# Patient Record
Sex: Female | Born: 1980 | Race: Black or African American | Hispanic: No | Marital: Single | Smoking: Never smoker
Health system: Southern US, Community
[De-identification: ages and names within clinical notes are randomized; demographics above are authoritative.]

## PROBLEM LIST (undated history)

## (undated) DIAGNOSIS — E282 Polycystic ovarian syndrome: Secondary | ICD-10-CM

---

## 2008-09-09 ENCOUNTER — Emergency Department (HOSPITAL_COMMUNITY): Admission: EM | Admit: 2008-09-09 | Discharge: 2008-09-09 | Payer: Self-pay | Admitting: *Deleted

## 2009-02-22 ENCOUNTER — Inpatient Hospital Stay: Payer: Self-pay | Admitting: Unknown Physician Specialty

## 2009-08-22 ENCOUNTER — Emergency Department: Payer: Self-pay | Admitting: Internal Medicine

## 2010-01-02 ENCOUNTER — Emergency Department: Payer: Self-pay | Admitting: Emergency Medicine

## 2010-07-29 ENCOUNTER — Emergency Department (HOSPITAL_BASED_OUTPATIENT_CLINIC_OR_DEPARTMENT_OTHER)
Admission: EM | Admit: 2010-07-29 | Discharge: 2010-07-29 | Payer: Self-pay | Source: Home / Self Care | Admitting: Internal Medicine

## 2010-07-29 LAB — URINALYSIS, ROUTINE W REFLEX MICROSCOPIC
Bilirubin Urine: NEGATIVE
Hemoglobin, Urine: NEGATIVE
Ketones, ur: NEGATIVE mg/dL
Nitrite: NEGATIVE
Protein, ur: NEGATIVE mg/dL
Specific Gravity, Urine: 1.022 (ref 1.005–1.030)
Urine Glucose, Fasting: NEGATIVE mg/dL
Urobilinogen, UA: 1 mg/dL (ref 0.0–1.0)
pH: 6.5 (ref 5.0–8.0)

## 2010-07-29 LAB — WET PREP, GENITAL
Clue Cells Wet Prep HPF POC: NONE SEEN
Trich, Wet Prep: NONE SEEN
Yeast Wet Prep HPF POC: NONE SEEN

## 2010-07-29 LAB — PREGNANCY, URINE: Preg Test, Ur: NEGATIVE

## 2010-08-08 LAB — GC/CHLAMYDIA PROBE AMP, GENITAL
Chlamydia, DNA Probe: NEGATIVE
GC Probe Amp, Genital: NEGATIVE

## 2010-10-25 ENCOUNTER — Ambulatory Visit: Payer: Self-pay | Admitting: Gynecology

## 2010-11-08 LAB — CULTURE, ROUTINE-ABSCESS: Gram Stain: NONE SEEN

## 2011-03-15 ENCOUNTER — Inpatient Hospital Stay (INDEPENDENT_AMBULATORY_CARE_PROVIDER_SITE_OTHER)
Admission: RE | Admit: 2011-03-15 | Discharge: 2011-03-15 | Disposition: A | Discharge status: Home / Self Care | Payer: Self-pay | Source: Ambulatory Visit | Attending: Family Medicine | Admitting: Family Medicine

## 2011-03-15 ENCOUNTER — Encounter: Payer: Self-pay | Admitting: Family Medicine

## 2011-03-15 DIAGNOSIS — R109 Unspecified abdominal pain: Secondary | ICD-10-CM

## 2011-03-15 DIAGNOSIS — R51 Headache: Secondary | ICD-10-CM

## 2011-03-15 DIAGNOSIS — K5900 Constipation, unspecified: Secondary | ICD-10-CM | POA: Insufficient documentation

## 2011-03-15 DIAGNOSIS — E282 Polycystic ovarian syndrome: Secondary | ICD-10-CM | POA: Insufficient documentation

## 2011-03-15 LAB — CONVERTED CEMR LAB
Beta hcg, urine, semiquantitative: NEGATIVE
Bilirubin Urine: NEGATIVE
Blood in Urine, dipstick: NEGATIVE
Glucose, Urine, Semiquant: NEGATIVE
Ketones, urine, test strip: NEGATIVE
Nitrite: NEGATIVE
Protein, U semiquant: NEGATIVE
Specific Gravity, Urine: >=1.03
Urobilinogen, UA: 0.2
WBC Urine, dipstick: NEGATIVE
pH: 6

## 2011-03-17 ENCOUNTER — Telehealth (INDEPENDENT_AMBULATORY_CARE_PROVIDER_SITE_OTHER): Payer: Self-pay | Admitting: Emergency Medicine

## 2011-06-26 NOTE — Telephone Encounter (Signed)
  Phone Note Outgoing Call   Call placed by: Lavell Islam RN,  March 17, 2011 12:20 PM Call placed to: Patient Action Taken: Phone Call Completed Summary of Call: Left message on voice mail at work: inquiring about patient's headache and GI status. Encouraged her to call with questions/concerns. Initial call taken by: Lavell Islam RN,  March 17, 2011 12:21 PM

## 2011-06-26 NOTE — Letter (Signed)
Summary: Out of Work  [facility]  [address]   [place], [place] [address]   Phone: [phone]  Fax: [phone]    March 15, 2011   Employee:  Tommi Emery    To Whom It May Concern:   For Medical reasons, please excuse the above named employee from work for the following dates:  Start:   03/15/2011  Return:   03/17/2011  If you need additional information, please feel free to contact our office.         Sincerely,    Hassan Rowan MD

## 2011-06-26 NOTE — Progress Notes (Signed)
Summary: Migraine/stomach aches   Vital Signs:  Patient Profile:   30 Years Old Female CC:      Headache, forehead x 3 days, stomach cramps x 2 days LMP:     02/06/2011 Height:     68 inches Weight:      230 pounds O2 Sat:      99 % O2 treatment:    Room Air Temp:     97.6 degrees F oral Pulse rate:   67 / minute Pulse rhythm:   regular Resp:     14 per minute BP sitting:   129 / 84  (left arm) Cuff size:   large  Pt. in pain?   yes    Location:   head    Intensity:   8    Type:       sharp  Vitals Entered By: Emilio Math (March 15, 2011 9:04 AM)  Menstrual History: LMP (date): 02/06/2011                   Prior Medication List:  No prior medications documented  Current Allergies: No known allergies History of Present Illness Chief Complaint: Headache, forehead x 3 days, stomach cramps x 2 days History of Present Illness: Patient reports not feeling well the last 3-4 days. She has had headaches mid center of her forehead that has been off and on for the last 3 datys. She has been eating but has had crampy abdominal pain constipation for the last 3 days and abdominal pain as well. She has had irregular periods before because she has polycystic ovarian DX. In fact she states that what is strange is that she had a period at all last month.   Current Problems: ABDOMINAL PAIN, ACUTE (ICD-789.00) POLYCYSTIC OVARIAN DISEASE (ICD-256.4) CONSTIPATION, MILD (ICD-564.00) HEADACHE (ICD-784.0)   Current Meds CITRATE OF MAGNESIA 1.745 GM/30ML SOLN (MAGNESIUM CITRATE) 1/2  bottle initial and no improvement or results in 2-4 hrs please take other 1/2 IMITREX 100 MG TABS (SUMATRIPTAN SUCCINATE) 1 by mouth at the onset of migraine. May repeat dose in one hour if headache not resolved maximum 2 dosages in 24 hrs  REVIEW OF SYSTEMS Constitutional Symptoms      Denies fever, chills, night sweats, weight loss, weight gain, and fatigue.  Eyes       Complains of eye pain.       Denies change in vision, eye discharge, glasses, contact lenses, and eye surgery. Ear/Nose/Throat/Mouth       Denies hearing loss/aids, change in hearing, ear pain, ear discharge, dizziness, frequent runny nose, frequent nose bleeds, sinus problems, sore throat, hoarseness, and tooth pain or bleeding.  Respiratory       Denies dry cough, productive cough, wheezing, shortness of breath, asthma, bronchitis, and emphysema/COPD.  Cardiovascular       Denies murmurs, chest pain, and tires easily with exhertion.    Gastrointestinal       Complains of stomach pain and constipation.      Denies nausea/vomiting, diarrhea, blood in bowel movements, and indigestion. Genitourniary       Denies painful urination, kidney stones, and loss of urinary control.      Comments: irregular cycles Neurological       Complains of headaches.      Denies paralysis, seizures, and fainting/blackouts. Musculoskeletal       Denies muscle pain, joint pain, joint stiffness, decreased range of motion, redness, swelling, muscle weakness, and gout.  Skin  Denies bruising, unusual mles/lumps or sores, and hair/skin or nail changes.  Psych       Denies mood changes, temper/anger issues, anxiety/stress, speech problems, depression, and sleep problems. Other Comments: Last bowel movement was Saturday, 5 days ago   Past History:  Family History: Last updated: 03/15/2011 Mother, Healthy Father, Healthy  Social History: Last updated: 03/15/2011 Non smoker No ETOH No Drugs  Past Medical History: Unremarkable  Past Surgical History: Denies surgical history  Family History: Reviewed history and no changes required. Mother, Healthy Father, Healthy  Social History: Reviewed history and no changes required. Non smoker No ETOH No Drugs Physical Exam General appearance: well developed, well nourished, no acute distress Head: normocephalic, atraumatic Eyes: conjunctivae and lids normal Pupils: equal,  round, reactive to light Neck: neck supple,  trachea midline, no masses Abdomen: soft, non-tender without obvious organomegaly Skin: no obvious rashes or lesions MSE: oriented to time, place, and person Assessment Additional workup planned New Problems: ABDOMINAL PAIN, ACUTE (ICD-789.00) POLYCYSTIC OVARIAN DISEASE (ICD-256.4) CONSTIPATION, MILD (ICD-564.00) HEADACHE (ICD-784.0)  improved after imitrexand toradol WBC negative  Patient Education: Patient and/or caregiver instructed in the following: rest.  Plan New Medications/Changes: IMITREX 100 MG TABS (SUMATRIPTAN SUCCINATE) 1 by mouth at the onset of migraine. May repeat dose in one hour if headache not resolved maximum 2 dosages in 24 hrs  #10 x 1, 03/15/2011, Hassan Rowan MD CITRATE OF MAGNESIA 1.745 GM/30ML SOLN (MAGNESIUM CITRATE) 1/2  bottle initial and no improvement or results in 2-4 hrs please take other 1/2  #1 bottle x 1, 03/15/2011, Hassan Rowan MD  New Orders: New Patient Level III [99203] CBC w/Diff [46962-95284] Ketorolac-Toradol 15mg  [J1885] Imitrex 6mg  - IM [CPT-J3030] Admin of Therapeutic Inj  intramuscular or subcutaneous [96372] Planning Comments:   offered to do CT scan but she declines due to the cost and no insurence   Follow Up: Follow up in 2-3 days if no improvement, Follow up on an as needed basis, Follow up with Primary Physician  The patient and/or caregiver has been counseled thoroughly with regard to medications prescribed including dosage, schedule, interactions, rationale for use, and possible side effects and they verbalize understanding.  Diagnoses and expected course of recovery discussed and will return if not improved as expected or if the condition worsens. Patient and/or caregiver verbalized understanding.   PROCEDURE: Follow up: marked improvement after toradol and imiotrex Prescriptions: IMITREX 100 MG TABS (SUMATRIPTAN SUCCINATE) 1 by mouth at the onset of migraine. May repeat dose  in one hour if headache not resolved maximum 2 dosages in 24 hrs  #10 x 1   Entered and Authorized by:   Hassan Rowan MD   Signed by:   Hassan Rowan MD on 03/15/2011   Method used:   Print then Give to Patient   RxID:   [id number] CITRATE OF MAGNESIA 1.745 GM/30ML SOLN (MAGNESIUM CITRATE) 1/2  bottle initial and no improvement or results in 2-4 hrs please take other 1/2  #1 bottle x 1   Entered and Authorized by:   Hassan Rowan MD   Signed by:   Hassan Rowan MD on 03/15/2011   Method used:   Print then Give to Patient   RxID:   [id number]   Patient Instructions: 1)  Please schedule a follow-up appointment as needed. 2)  Please schedule an appointment with your primary doctor in :3-7 days may need outpatient CT scan 3)  If headache gets worse please go to local ED for Ct scan 4)  Recommended  remaining out of work for [employer].  Medication Administration  Injection # 1:    Medication: Imitrex 6mg  - IM    Diagnosis: HEADACHE (ICD-784.0)    Route: SQ    Site: RUOQ gluteus    Exp Date: 01/22/2012    Lot #: [id number]    Mfr: Sagent    Comments: Given SQ    Given by: Emilio Math (March 15, 2011 10:06 AM)  Injection # 2:    Medication: Ketorolac-Toradol 15mg     Diagnosis: HEADACHE (ICD-784.0)    Route: IM    Site: RUOQ gluteus    Exp Date: 09/21/2012    Lot #: [id number]    Mfr: Hospira    Comments: 60 mgs given    Given by: Emilio Math (March 15, 2011 10:07 AM)  Orders Added: 1)  New Patient Level III [99203] 2)  CBC w/Diff [65784-69629] 3)  Ketorolac-Toradol 15mg  [J1885] 4)  Imitrex 6mg  - IM [CPT-J3030] 5)  Admin of Therapeutic Inj  intramuscular or subcutaneous [96372]    Laboratory Results   Urine Tests  Date/Time Received: March 15, 2011 9:19 AM  Date/Time Reported: March 15, 2011 9:19 AM   Routine Urinalysis   Color: yellow Appearance: Clear Glucose: negative   (Normal Range: Negative) Bilirubin: negative   (Normal Range: Negative) Ketone:  negative   (Normal Range: Negative) Spec. Gravity: >=1.030   (Normal Range: 1.003-1.035) Blood: negative   (Normal Range: Negative) pH: 6.0   (Normal Range: 5.0-8.0) Protein: negative   (Normal Range: Negative) Urobilinogen: 0.2   (Normal Range: 0-1) Nitrite: negative   (Normal Range: Negative) Leukocyte Esterace: negative   (Normal Range: Negative)    Urine HCG: negative      Medication Administration  Injection # 1:    Medication: Imitrex 6mg  - IM    Diagnosis: HEADACHE (ICD-784.0)    Route: SQ    Site: RUOQ gluteus    Exp Date: 01/22/2012    Lot #: [id number]    Mfr: Sagent    Comments: Given SQ    Given by: Emilio Math (March 15, 2011 10:06 AM)  Injection # 2:    Medication: Ketorolac-Toradol 15mg     Diagnosis: HEADACHE (ICD-784.0)    Route: IM    Site: RUOQ gluteus    Exp Date: 09/21/2012    Lot #: [id number]    Mfr: Hospira    Comments: 60 mgs given    Given by: Emilio Math (March 15, 2011 10:07 AM)  Orders Added: 1)  New Patient Level III [99203] 2)  CBC w/Diff [25366-44034] 3)  Ketorolac-Toradol 15mg  [J1885] 4)  Imitrex 6mg  - IM [CPT-J3030] 5)  Admin of Therapeutic Inj  intramuscular or subcutaneous [74259]

## 2012-06-13 ENCOUNTER — Emergency Department: Payer: Self-pay | Admitting: Emergency Medicine

## 2012-06-13 LAB — COMPREHENSIVE METABOLIC PANEL
Albumin: 3.7 g/dL (ref 3.4–5.0)
Alkaline Phosphatase: 100 U/L (ref 50–136)
Bilirubin,Total: 0.2 mg/dL (ref 0.2–1.0)
Co2: 30 mmol/L (ref 21–32)
Creatinine: 1.01 mg/dL (ref 0.60–1.30)
EGFR (Non-African Amer.): >60
Osmolality: 277 (ref 275–301)
SGPT (ALT): 27 U/L (ref 12–78)
Sodium: 139 mmol/L (ref 136–145)

## 2012-06-13 LAB — URINALYSIS, COMPLETE
Bilirubin,UR: NEGATIVE
Blood: NEGATIVE
Glucose,UR: NEGATIVE mg/dL (ref 0–75)
Nitrite: NEGATIVE
Protein: NEGATIVE
RBC,UR: 4 /HPF (ref 0–5)
Specific Gravity: 1.025 (ref 1.003–1.030)
Squamous Epithelial: 4
WBC UR: 2 /HPF (ref 0–5)

## 2012-06-13 LAB — CBC
Platelet: 342 10*3/uL (ref 150–440)
RBC: 4.28 10*6/uL (ref 3.80–5.20)
RDW: 13.9 % (ref 11.5–14.5)
WBC: 6.2 10*3/uL (ref 3.6–11.0)

## 2012-06-13 LAB — WET PREP, GENITAL

## 2012-06-13 LAB — LIPASE, BLOOD: Lipase: 146 U/L (ref 73–393)

## 2012-08-05 ENCOUNTER — Emergency Department (HOSPITAL_BASED_OUTPATIENT_CLINIC_OR_DEPARTMENT_OTHER)
Admission: EM | Admit: 2012-08-05 | Discharge: 2012-08-05 | Disposition: A | Discharge status: Home / Self Care | Payer: 59 | Attending: Emergency Medicine | Admitting: Emergency Medicine

## 2012-08-05 ENCOUNTER — Encounter (HOSPITAL_BASED_OUTPATIENT_CLINIC_OR_DEPARTMENT_OTHER): Payer: Self-pay | Admitting: Family Medicine

## 2012-08-05 DIAGNOSIS — Z23 Encounter for immunization: Secondary | ICD-10-CM | POA: Insufficient documentation

## 2012-08-05 DIAGNOSIS — Y929 Unspecified place or not applicable: Secondary | ICD-10-CM | POA: Insufficient documentation

## 2012-08-05 DIAGNOSIS — Y999 Unspecified external cause status: Secondary | ICD-10-CM | POA: Insufficient documentation

## 2012-08-05 DIAGNOSIS — Z8742 Personal history of other diseases of the female genital tract: Secondary | ICD-10-CM | POA: Insufficient documentation

## 2012-08-05 DIAGNOSIS — T6391XA Toxic effect of contact with unspecified venomous animal, accidental (unintentional), initial encounter: Secondary | ICD-10-CM | POA: Insufficient documentation

## 2012-08-05 DIAGNOSIS — T63301A Toxic effect of unspecified spider venom, accidental (unintentional), initial encounter: Secondary | ICD-10-CM

## 2012-08-05 DIAGNOSIS — T63391A Toxic effect of venom of other spider, accidental (unintentional), initial encounter: Secondary | ICD-10-CM | POA: Insufficient documentation

## 2012-08-05 HISTORY — DX: Polycystic ovarian syndrome: E28.2

## 2012-08-05 MED ORDER — SULFAMETHOXAZOLE-TRIMETHOPRIM 800-160 MG PO TABS: 1.0000 | ORAL_TABLET | Freq: Two times a day (BID) | ORAL | Status: DC

## 2012-08-05 MED ORDER — PREDNISONE 10 MG PO TABS
60.0000 mg | ORAL_TABLET | Freq: Once | ORAL | Status: AC
  Administered 2012-08-05: 60 mg via ORAL
  Filled 2012-08-05: qty 1

## 2012-08-05 MED ORDER — PREDNISONE 10 MG PO TABS: 40.0000 mg | ORAL_TABLET | Freq: Every day | ORAL | Status: DC

## 2012-08-05 MED ORDER — TETANUS-DIPHTH-ACELL PERTUSSIS 5-2.5-18.5 LF-MCG/0.5 IM SUSP
0.5000 mL | Freq: Once | INTRAMUSCULAR | Status: AC
  Administered 2012-08-05: 0.5 mL via INTRAMUSCULAR
  Filled 2012-08-05: qty 0.5

## 2012-08-05 NOTE — ED Notes (Signed)
Pt c/o bite at base of 3rd finger on left hand last night. Redness and swelling noted.

## 2012-08-05 NOTE — ED Provider Notes (Signed)
History     CSN: [id number]  Arrival date & time 08/05/12  4132   First MD Initiated Contact with Patient 08/05/12 [phone]      Chief Complaint  Patient presents with  . Insect Bite    (Consider location/radiation/quality/duration/timing/severity/associated sxs/prior treatment) HPI Comments: Patient presents with a spider bite to her left hand. She states that a brown small spider at her third finger last night. She states that she's had more redness and pain to the area since then. It's not pruritic. She denies any drainage from the wound. She does have a history of staph infections in the past. Her last tetanus is unknown. She denies any pain radiating to the rest of the hand or up the arm. She denies any rash or itching. She denies any shortness of breath.   Past Medical History  Diagnosis Date  . Polycystic ovarian disease     History reviewed. No pertinent past surgical history.  No family history on file.  History  Substance Use Topics  . Smoking status: Never Smoker   . Smokeless tobacco: Not on file  . Alcohol Use: No    OB History    Grav Para Term Preterm Abortions TAB SAB Ect Mult Living                  Review of Systems  Constitutional: Negative for fever.  HENT: Negative for facial swelling.   Respiratory: Negative for shortness of breath.   Cardiovascular: Negative for chest pain and leg swelling.  Gastrointestinal: Negative for nausea and vomiting.  Musculoskeletal: Negative for myalgias and arthralgias.  Skin: Positive for wound. Negative for rash.  Neurological: Negative for dizziness.    Allergies  Review of patient's allergies indicates no known allergies.  Home Medications   Current Outpatient Rx  Name  Route  Sig  Dispense  Refill  . PREDNISONE 10 MG PO TABS   Oral   Take 4 tablets (40 mg total) by mouth daily.   20 tablet   0   . SULFAMETHOXAZOLE-TRIMETHOPRIM 800-160 MG PO TABS   Oral   Take 1 tablet by mouth every 12 (twelve)  hours.   20 tablet   0     BP 140/87  Pulse 84  Temp 98.7 F (37.1 C) (Oral)  Resp 20  Ht 5\' 10"  (1.778 m)  Wt 215 lb (97.523 kg)  BMI 30.85 kg/m2  SpO2 100%  Physical Exam  Constitutional: She appears well-developed and well-nourished.  Cardiovascular: Normal rate.   Pulmonary/Chest: Effort normal.  Skin: Skin is warm and dry.       Patient is a 1 cm erythematous indurated area to the dorsal surface of the left third finger over the maximal phalanx. There is a small blister at the top of the wound. There is no redness extending to the rest of the hand. There is no fluctuance. There is no drainage. There is no pain with range of motion of the finger.    ED Course  Procedures (including critical care time)  Labs Reviewed - No data to display No results found.   1. Spider bite       MDM  Patient with what appears to be a localized reaction to a spider bite on her finger. There's no evidence of tenosynovitis. There is no drainable abscess. I feel this is most likely a localized reaction to the venom. However given her history of MRSA with a worsening pain to the area will go ahead and start  her on Bactrim. As well as prednisone for the reaction. Her tetanus shot was updated. She was advised to return here if the redness worsens or the wound starts looking worse.        Rolan Bucco, MD 08/05/12 [phone]

## 2013-02-15 ENCOUNTER — Emergency Department: Payer: Self-pay | Admitting: Emergency Medicine

## 2013-08-07 ENCOUNTER — Encounter (HOSPITAL_BASED_OUTPATIENT_CLINIC_OR_DEPARTMENT_OTHER): Payer: Self-pay | Admitting: Emergency Medicine

## 2013-08-07 ENCOUNTER — Emergency Department (HOSPITAL_BASED_OUTPATIENT_CLINIC_OR_DEPARTMENT_OTHER)
Admission: EM | Admit: 2013-08-07 | Discharge: 2013-08-07 | Disposition: A | Discharge status: Home / Self Care | Payer: 59 | Attending: Emergency Medicine | Admitting: Emergency Medicine

## 2013-08-07 DIAGNOSIS — R110 Nausea: Secondary | ICD-10-CM | POA: Insufficient documentation

## 2013-08-07 DIAGNOSIS — Z8639 Personal history of other endocrine, nutritional and metabolic disease: Secondary | ICD-10-CM | POA: Insufficient documentation

## 2013-08-07 DIAGNOSIS — J329 Chronic sinusitis, unspecified: Secondary | ICD-10-CM

## 2013-08-07 DIAGNOSIS — R350 Frequency of micturition: Secondary | ICD-10-CM | POA: Insufficient documentation

## 2013-08-07 DIAGNOSIS — IMO0001 Reserved for inherently not codable concepts without codable children: Secondary | ICD-10-CM | POA: Insufficient documentation

## 2013-08-07 DIAGNOSIS — H9209 Otalgia, unspecified ear: Secondary | ICD-10-CM | POA: Insufficient documentation

## 2013-08-07 DIAGNOSIS — J111 Influenza due to unidentified influenza virus with other respiratory manifestations: Secondary | ICD-10-CM | POA: Insufficient documentation

## 2013-08-07 DIAGNOSIS — M549 Dorsalgia, unspecified: Secondary | ICD-10-CM | POA: Insufficient documentation

## 2013-08-07 DIAGNOSIS — Z862 Personal history of diseases of the blood and blood-forming organs and certain disorders involving the immune mechanism: Secondary | ICD-10-CM | POA: Insufficient documentation

## 2013-08-07 DIAGNOSIS — IMO0002 Reserved for concepts with insufficient information to code with codable children: Secondary | ICD-10-CM | POA: Insufficient documentation

## 2013-08-07 DIAGNOSIS — R6889 Other general symptoms and signs: Secondary | ICD-10-CM

## 2013-08-07 DIAGNOSIS — Z792 Long term (current) use of antibiotics: Secondary | ICD-10-CM | POA: Insufficient documentation

## 2013-08-07 DIAGNOSIS — R000 Tachycardia, unspecified: Secondary | ICD-10-CM | POA: Insufficient documentation

## 2013-08-07 MED ORDER — AMOXICILLIN 500 MG PO CAPS: 500.0000 mg | ORAL_CAPSULE | Freq: Three times a day (TID) | ORAL | Status: DC

## 2013-08-07 MED ORDER — PHENYLEPH-PROMETHAZINE-COD 5-6.25-10 MG/5ML PO SYRP: 5.0000 mL | ORAL_SOLUTION | Freq: Four times a day (QID) | ORAL | Status: DC | PRN

## 2013-08-07 NOTE — ED Provider Notes (Signed)
CSN: [id number]     Arrival date & time 08/07/13  1419 History   First MD Initiated Contact with Patient 08/07/13 1436     Chief Complaint  Patient presents with  . Influenza   (Consider location/radiation/quality/duration/timing/severity/associated sxs/prior Treatment) Patient is a 33 y.o. female presenting with flu symptoms. The history is provided by the patient.  Influenza Presenting symptoms: cough, fever, headache, myalgias, nausea, rhinorrhea and sore throat   Presenting symptoms: no vomiting   Associated symptoms: chills, ear pain and nasal congestion   Associated symptoms: no neck stiffness    Tareva Leske is a 33 y.o. female who presents to the ED with cough, cold, congestion, fever and facial pain. She states she almost feels like she has dental pain.   Past Medical History  Diagnosis Date  . Polycystic ovarian disease    History reviewed. No pertinent past surgical history. No family history on file. History  Substance Use Topics  . Smoking status: Never Smoker   . Smokeless tobacco: Not on file  . Alcohol Use: No   OB History   Grav Para Term Preterm Abortions TAB SAB Ect Mult Living                 Review of Systems  Constitutional: Positive for fever and chills.  HENT: Positive for congestion, ear pain, rhinorrhea, sinus pressure and sore throat.   Eyes: Negative for redness, itching and visual disturbance.  Respiratory: Positive for cough.   Gastrointestinal: Positive for nausea. Negative for vomiting and abdominal pain.  Genitourinary: Positive for frequency. Negative for dysuria and urgency.  Musculoskeletal: Positive for back pain and myalgias. Negative for neck stiffness.  Skin: Negative for rash.  Neurological: Positive for headaches. Negative for seizures and syncope.  Psychiatric/Behavioral: Negative for confusion. The patient is not nervous/anxious.     Allergies  Review of patient's allergies indicates no known allergies.  Home Medications    Current Outpatient Rx  Name  Route  Sig  Dispense  Refill  . predniSONE (DELTASONE) 10 MG tablet   Oral   Take 4 tablets (40 mg total) by mouth daily.   20 tablet   0   . sulfamethoxazole-trimethoprim (SEPTRA DS) 800-160 MG per tablet   Oral   Take 1 tablet by mouth every 12 (twelve) hours.   20 tablet   0    BP 139/75  Pulse 104  Temp(Src) 100.8 F (38.2 C) (Oral)  Resp 20  Ht 5\' 9"  (1.753 m)  Wt 228 lb (103.42 kg)  BMI 33.65 kg/m2  SpO2 98% Physical Exam  Nursing note and vitals reviewed. Constitutional: She is oriented to person, place, and time. She appears well-developed and well-nourished.  HENT:  Head: Normocephalic and atraumatic.  Right Ear: Tympanic membrane normal.  Left Ear: Tympanic membrane normal.  Nose: Right sinus exhibits maxillary sinus tenderness. Left sinus exhibits maxillary sinus tenderness.  Mouth/Throat: Uvula is midline and mucous membranes are normal. Posterior oropharyngeal erythema present.  Eyes: Conjunctivae and EOM are normal.  Neck: Neck supple.  Cardiovascular: Regular rhythm.  Tachycardia present.   Pulmonary/Chest: Effort normal. She has no wheezes. She has no rales.  Abdominal: Soft. Bowel sounds are normal. There is no tenderness.  Musculoskeletal: Normal range of motion.  Neurological: She is alert and oriented to person, place, and time. No cranial nerve deficit.  Skin: Skin is warm and dry.  Psychiatric: She has a normal mood and affect. Her behavior is normal.    ED Course  Procedures  MDM  33 y.o. female with cough, congestion, fever and sinus tenderness. Will treat symptoms.  Discussed with the patient clinical findings and plan of care. All questioned fully answered. She will return if any problems arise.    Medication List    TAKE these medications       amoxicillin 500 MG capsule  Commonly known as:  AMOXIL  Take 1 capsule (500 mg total) by mouth 3 (three) times daily.     Phenyleph-Promethazine-Cod  5-6.25-10 MG/5ML Syrp  Take 5 mLs by mouth every 6 (six) hours as needed.      ASK your doctor about these medications       predniSONE 10 MG tablet  Commonly known as:  DELTASONE  Take 4 tablets (40 mg total) by mouth daily.     sulfamethoxazole-trimethoprim 800-160 MG per tablet  Commonly known as:  SEPTRA DS  Take 1 tablet by mouth every 12 (twelve) hours.          Ashley Mur[place], [place] 08/07/13 2251

## 2013-08-07 NOTE — ED Notes (Signed)
Fever, body aches, headache, productive cough with yellow sputum and chest soreness.

## 2013-08-07 NOTE — ED Notes (Signed)
Pt directed to pharmacy to pick up RX x 2

## 2013-08-07 NOTE — Discharge Instructions (Signed)
Do not take the cough medication if you are driving as it will make you sleepy. Return as needed for problems. Take tylenol or ibuprofen as needed for fever and aching

## 2013-08-08 NOTE — ED Provider Notes (Signed)
Medical screening examination/treatment/procedure(s) were performed by non-physician practitioner and as supervising physician I was immediately available for consultation/collaboration.     Veryl Speak, MD 08/08/13 1346

## 2013-09-29 ENCOUNTER — Encounter (HOSPITAL_BASED_OUTPATIENT_CLINIC_OR_DEPARTMENT_OTHER): Payer: Self-pay | Admitting: Emergency Medicine

## 2013-09-29 ENCOUNTER — Emergency Department (HOSPITAL_BASED_OUTPATIENT_CLINIC_OR_DEPARTMENT_OTHER)
Admission: EM | Admit: 2013-09-29 | Discharge: 2013-09-29 | Disposition: A | Discharge status: Home / Self Care | Payer: 59 | Attending: Emergency Medicine | Admitting: Emergency Medicine

## 2013-09-29 DIAGNOSIS — Z8742 Personal history of other diseases of the female genital tract: Secondary | ICD-10-CM | POA: Insufficient documentation

## 2013-09-29 DIAGNOSIS — J029 Acute pharyngitis, unspecified: Secondary | ICD-10-CM

## 2013-09-29 LAB — RAPID STREP SCREEN (MED CTR MEBANE ONLY): Streptococcus, Group A Screen (Direct): NEGATIVE

## 2013-09-29 NOTE — ED Provider Notes (Signed)
CSN: [id number]     Arrival date & time 09/29/13  4270 History   First MD Initiated Contact with Patient 09/29/13 1014     Chief Complaint  Patient presents with  . Sore Throat     (Consider location/radiation/quality/duration/timing/severity/associated sxs/prior Treatment) Patient is a 33 y.o. female presenting with pharyngitis. The history is provided by the patient. No language interpreter was used.  Sore Throat This is a new problem. The current episode started yesterday. The problem occurs constantly. The problem has been gradually worsening. Associated symptoms include a sore throat. Pertinent negatives include no fever. She has tried nothing for the symptoms. The treatment provided moderate relief.    Past Medical History  Diagnosis Date  . Polycystic ovarian disease    History reviewed. No pertinent past surgical history. History reviewed. No pertinent family history. History  Substance Use Topics  . Smoking status: Never Smoker   . Smokeless tobacco: Not on file  . Alcohol Use: No   OB History   Grav Para Term Preterm Abortions TAB SAB Ect Mult Living                 Review of Systems  Constitutional: Negative for fever.  HENT: Positive for sore throat.   All other systems reviewed and are negative.      Allergies  Review of patient's allergies indicates no known allergies.  Home Medications   Current Outpatient Rx  Name  Route  Sig  Dispense  Refill  . amoxicillin (AMOXIL) 500 MG capsule   Oral   Take 1 capsule (500 mg total) by mouth 3 (three) times daily.   21 capsule   0   . Phenyleph-Promethazine-Cod 5-6.25-10 MG/5ML SYRP   Oral   Take 5 mLs by mouth every 6 (six) hours as needed.   120 mL   0   . predniSONE (DELTASONE) 10 MG tablet   Oral   Take 4 tablets (40 mg total) by mouth daily.   20 tablet   0   . sulfamethoxazole-trimethoprim (SEPTRA DS) 800-160 MG per tablet   Oral   Take 1 tablet by mouth every 12 (twelve) hours.   20  tablet   0    BP 114/79  Pulse 73  Temp(Src) 98.4 F (36.9 C) (Oral)  Resp 18  Ht 5\' 9"  (1.753 m)  Wt 223 lb (101.152 kg)  BMI 32.92 kg/m2  SpO2 99% Physical Exam  Nursing note and vitals reviewed. Constitutional: She is oriented to person, place, and time. She appears well-developed and well-nourished.  HENT:  Head: Normocephalic and atraumatic.  Right Ear: External ear normal.  Left Ear: External ear normal.  Tonsils swollen   Eyes: Pupils are equal, round, and reactive to light.  Neck: Normal range of motion.  Cardiovascular: Normal rate and normal heart sounds.   Pulmonary/Chest: Effort normal and breath sounds normal.  Abdominal: Soft.  Musculoskeletal: Normal range of motion.  Neurological: She is alert and oriented to person, place, and time. She has normal reflexes.  Skin: Skin is warm.  Psychiatric: She has a normal mood and affect.    ED Course  Procedures (including critical care time) Labs Review Labs Reviewed  RAPID STREP SCREEN  CULTURE, GROUP A STREP   Imaging Review No results found.   EKG Interpretation None      MDM   Final diagnoses:  None    Pt advised tylenol,  Warm salt water gargles.  lozenges    [place], PA-C 09/29/13  1053 

## 2013-09-29 NOTE — ED Provider Notes (Signed)
Medical screening examination/treatment/procedure(s) were performed by non-physician practitioner and as supervising physician I was immediately available for consultation/collaboration.   EKG Interpretation None        Ephraim Hamburger, MD 09/29/13 1623

## 2013-09-29 NOTE — Discharge Instructions (Signed)
Viral Infections A viral infection can be caused by different types of viruses.Most viral infections are not serious and resolve on their own. However, some infections may cause severe symptoms and may lead to further complications. SYMPTOMS Viruses can frequently cause:  Minor sore throat.  Aches and pains.  Headaches.  Runny nose.  Different types of rashes.  Watery eyes.  Tiredness.  Cough.  Loss of appetite.  Gastrointestinal infections, resulting in nausea, vomiting, and diarrhea. These symptoms do not respond to antibiotics because the infection is not caused by bacteria. However, you might catch a bacterial infection following the viral infection. This is sometimes called a "superinfection." Symptoms of such a bacterial infection may include:  Worsening sore throat with pus and difficulty swallowing.  Swollen neck glands.  Chills and a high or persistent fever.  Severe headache.  Tenderness over the sinuses.  Persistent overall ill feeling (malaise), muscle aches, and tiredness (fatigue).  Persistent cough.  Yellow, green, or brown mucus production with coughing. HOME CARE INSTRUCTIONS   Only take over-the-counter or prescription medicines for pain, discomfort, diarrhea, or fever as directed by your caregiver.  Drink enough water and fluids to keep your urine clear or pale yellow. Sports drinks can provide valuable electrolytes, sugars, and hydration.  Get plenty of rest and maintain proper nutrition. Soups and broths with crackers or rice are fine. SEEK IMMEDIATE MEDICAL CARE IF:   You have severe headaches, shortness of breath, chest pain, neck pain, or an unusual rash.  You have uncontrolled vomiting, diarrhea, or you are unable to keep down fluids.  You or your child has an oral temperature above 102 F (38.9 C), not controlled by medicine.  Your baby is older than 3 months with a rectal temperature of 102 F (38.9 C) or higher.  Your baby is 58  months old or younger with a rectal temperature of 100.4 F (38 C) or higher. MAKE SURE YOU:   Understand these instructions.  Will watch your condition.  Will get help right away if you are not doing well or get worse. Document Released: 04/19/2005 Document Revised: 10/02/2011 Document Reviewed: 11/14/2010 [facility] Patient Information 2014 [place], [place]. Sore Throat A sore throat is pain, burning, irritation, or scratchiness of the throat. There is often pain or tenderness when swallowing or talking. A sore throat may be accompanied by other symptoms, such as coughing, sneezing, fever, and swollen neck glands. A sore throat is often the first sign of another sickness, such as a cold, flu, strep throat, or mononucleosis (commonly known as mono). Most sore throats go away without medical treatment. CAUSES  The most common causes of a sore throat include:  A viral infection, such as a cold, flu, or mono.  A bacterial infection, such as strep throat, tonsillitis, or whooping cough.  Seasonal allergies.  Dryness in the air.  Irritants, such as smoke or pollution.  Gastroesophageal reflux disease (GERD). HOME CARE INSTRUCTIONS   Only take over-the-counter medicines as directed by your caregiver.  Drink enough fluids to keep your urine clear or pale yellow.  Rest as needed.  Try using throat sprays, lozenges, or sucking on hard candy to ease any pain (if older than 4 years or as directed).  Sip warm liquids, such as broth, herbal tea, or warm water with honey to relieve pain temporarily. You may also eat or drink cold or frozen liquids such as frozen ice pops.  Gargle with salt water (mix 1 tsp salt with 8 oz of water).  Do not smoke and avoid secondhand smoke.  Put a cool-mist humidifier in your bedroom at night to moisten the air. You can also turn on a hot shower and sit in the bathroom with the door closed for 5 10 minutes. SEEK IMMEDIATE MEDICAL CARE IF:  You have  difficulty breathing.  You are unable to swallow fluids, soft foods, or your saliva.  You have increased swelling in the throat.  Your sore throat does not get better in 7 days.  You have nausea and vomiting.  You have a fever or persistent symptoms for more than 2 3 days.  You have a fever and your symptoms suddenly get worse. MAKE SURE YOU:   Understand these instructions.  Will watch your condition.  Will get help right away if you are not doing well or get worse. Document Released: 08/17/2004 Document Revised: 06/26/2012 Document Reviewed: 03/17/2012 [facility] Patient Information 2014 [place], [place].

## 2013-09-29 NOTE — ED Notes (Signed)
Sore throat.

## 2013-10-01 LAB — CULTURE, GROUP A STREP

## 2014-03-22 ENCOUNTER — Encounter (HOSPITAL_BASED_OUTPATIENT_CLINIC_OR_DEPARTMENT_OTHER): Payer: Self-pay | Admitting: Emergency Medicine

## 2014-03-22 ENCOUNTER — Emergency Department (HOSPITAL_BASED_OUTPATIENT_CLINIC_OR_DEPARTMENT_OTHER): Payer: 59

## 2014-03-22 ENCOUNTER — Emergency Department (HOSPITAL_BASED_OUTPATIENT_CLINIC_OR_DEPARTMENT_OTHER)
Admission: EM | Admit: 2014-03-22 | Discharge: 2014-03-22 | Disposition: A | Discharge status: Home / Self Care | Payer: 59 | Attending: Emergency Medicine | Admitting: Emergency Medicine

## 2014-03-22 DIAGNOSIS — Z8639 Personal history of other endocrine, nutritional and metabolic disease: Secondary | ICD-10-CM | POA: Insufficient documentation

## 2014-03-22 DIAGNOSIS — Z792 Long term (current) use of antibiotics: Secondary | ICD-10-CM | POA: Insufficient documentation

## 2014-03-22 DIAGNOSIS — S5402XA Injury of ulnar nerve at forearm level, left arm, initial encounter: Secondary | ICD-10-CM

## 2014-03-22 DIAGNOSIS — R079 Chest pain, unspecified: Secondary | ICD-10-CM | POA: Insufficient documentation

## 2014-03-22 DIAGNOSIS — Z3202 Encounter for pregnancy test, result negative: Secondary | ICD-10-CM | POA: Insufficient documentation

## 2014-03-22 DIAGNOSIS — IMO0002 Reserved for concepts with insufficient information to code with codable children: Secondary | ICD-10-CM | POA: Insufficient documentation

## 2014-03-22 DIAGNOSIS — H53149 Visual discomfort, unspecified: Secondary | ICD-10-CM | POA: Insufficient documentation

## 2014-03-22 DIAGNOSIS — S5400XA Injury of ulnar nerve at forearm level, unspecified arm, initial encounter: Secondary | ICD-10-CM | POA: Insufficient documentation

## 2014-03-22 DIAGNOSIS — Z862 Personal history of diseases of the blood and blood-forming organs and certain disorders involving the immune mechanism: Secondary | ICD-10-CM | POA: Insufficient documentation

## 2014-03-22 DIAGNOSIS — Y929 Unspecified place or not applicable: Secondary | ICD-10-CM | POA: Insufficient documentation

## 2014-03-22 DIAGNOSIS — G43809 Other migraine, not intractable, without status migrainosus: Secondary | ICD-10-CM | POA: Insufficient documentation

## 2014-03-22 DIAGNOSIS — Y939 Activity, unspecified: Secondary | ICD-10-CM | POA: Insufficient documentation

## 2014-03-22 DIAGNOSIS — X58XXXA Exposure to other specified factors, initial encounter: Secondary | ICD-10-CM | POA: Insufficient documentation

## 2014-03-22 DIAGNOSIS — Z79899 Other long term (current) drug therapy: Secondary | ICD-10-CM | POA: Insufficient documentation

## 2014-03-22 LAB — CBC WITH DIFFERENTIAL/PLATELET
Basophils Absolute: 0 10*3/uL (ref 0.0–0.1)
Basophils Relative: 0 % (ref 0–1)
Eosinophils Absolute: 0.1 10*3/uL (ref 0.0–0.7)
Eosinophils Relative: 2 % (ref 0–5)
HCT: 34.6 % — ABNORMAL LOW (ref 36.0–46.0)
Hemoglobin: 11.4 g/dL — ABNORMAL LOW (ref 12.0–15.0)
Lymphocytes Relative: 37 % (ref 12–46)
Lymphs Abs: 2.6 10*3/uL (ref 0.7–4.0)
MCH: 29.1 pg (ref 26.0–34.0)
MCHC: 32.9 g/dL (ref 30.0–36.0)
MCV: 88.3 fL (ref 78.0–100.0)
Monocytes Absolute: 0.8 10*3/uL (ref 0.1–1.0)
Monocytes Relative: 11 % (ref 3–12)
Neutro Abs: 3.5 10*3/uL (ref 1.7–7.7)
Neutrophils Relative %: 50 % (ref 43–77)
Platelets: 345 10*3/uL (ref 150–400)
RBC: 3.92 MIL/uL (ref 3.87–5.11)
RDW: 13.5 % (ref 11.5–15.5)
WBC: 7 10*3/uL (ref 4.0–10.5)

## 2014-03-22 LAB — COMPREHENSIVE METABOLIC PANEL
ALT: 17 U/L (ref 0–35)
AST: 19 U/L (ref 0–37)
Albumin: 3.4 g/dL — ABNORMAL LOW (ref 3.5–5.2)
Alkaline Phosphatase: 81 U/L (ref 39–117)
Anion gap: 14 (ref 5–15)
BUN: 11 mg/dL (ref 6–23)
CO2: 25 mEq/L (ref 19–32)
Calcium: 9.3 mg/dL (ref 8.4–10.5)
Chloride: 103 mEq/L (ref 96–112)
Creatinine, Ser: 1.1 mg/dL (ref 0.50–1.10)
GFR calc Af Amer: 76 mL/min — ABNORMAL LOW (ref >90)
GFR calc non Af Amer: 66 mL/min — ABNORMAL LOW (ref >90)
Glucose, Bld: 95 mg/dL (ref 70–99)
Potassium: 4.1 mEq/L (ref 3.7–5.3)
Sodium: 142 mEq/L (ref 137–147)
Total Bilirubin: 0.2 mg/dL — ABNORMAL LOW (ref 0.3–1.2)
Total Protein: 7.5 g/dL (ref 6.0–8.3)

## 2014-03-22 LAB — TROPONIN I: Troponin I: <0.3 ng/mL (ref <0.30)

## 2014-03-22 LAB — PREGNANCY, URINE: Preg Test, Ur: NEGATIVE

## 2014-03-22 MED ORDER — ONDANSETRON 4 MG PO TBDP: 4.0000 mg | ORAL_TABLET | Freq: Three times a day (TID) | ORAL | Status: DC | PRN

## 2014-03-22 MED ORDER — METOCLOPRAMIDE HCL 5 MG/ML IJ SOLN
10.0000 mg | Freq: Once | INTRAMUSCULAR | Status: AC
  Administered 2014-03-22: 10 mg via INTRAVENOUS
  Filled 2014-03-22: qty 2

## 2014-03-22 MED ORDER — KETOROLAC TROMETHAMINE 30 MG/ML IJ SOLN
30.0000 mg | Freq: Once | INTRAMUSCULAR | Status: AC
  Administered 2014-03-22: 30 mg via INTRAVENOUS
  Filled 2014-03-22: qty 1

## 2014-03-22 MED ORDER — METOCLOPRAMIDE HCL 10 MG PO TABS: 10.0000 mg | ORAL_TABLET | Freq: Four times a day (QID) | ORAL | Status: DC

## 2014-03-22 MED ORDER — DIPHENHYDRAMINE HCL 50 MG/ML IJ SOLN
25.0000 mg | Freq: Once | INTRAMUSCULAR | Status: AC
  Administered 2014-03-22: 25 mg via INTRAVENOUS
  Filled 2014-03-22: qty 1

## 2014-03-22 MED ORDER — NAPROXEN 500 MG PO TABS: 500.0000 mg | ORAL_TABLET | Freq: Two times a day (BID) | ORAL | Status: DC

## 2014-03-22 NOTE — ED Notes (Signed)
Pt presents to ED with complaints of left arm numbness left eye pain sharp chest pain since yesterday. Pt was seen at [facility] regional yesterday but left before testing could be completed.

## 2014-03-22 NOTE — ED Notes (Signed)
Pt discharged to home with family. NAD.  

## 2014-03-22 NOTE — ED Provider Notes (Signed)
CSN: [id number]     Arrival date & time 03/22/14  1107 History   First MD Initiated Contact with Patient 03/22/14 1128     Chief Complaint  Patient presents with  . Chest Pain  . Numbness      HPI  Patient presented for evaluation of chest pain, left arm tingling, and a headache. Symptoms started 2 days ago with pain in her chest. She went [facility] last night. Her pain went away while she was there. However, she states "they wanted a CAT scan and there were 13 people ahead of me". She left there. She's not had recurrence of her chest pain. She has pain in her left eye and left head. She states her left hand has been "tingling" for the last several weeks.  Past Medical History  Diagnosis Date  . Polycystic ovarian disease    History reviewed. No pertinent past surgical history. No family history on file. History  Substance Use Topics  . Smoking status: Never Smoker   . Smokeless tobacco: Not on file  . Alcohol Use: No   OB History   Grav Para Term Preterm Abortions TAB SAB Ect Mult Living                 Review of Systems  Constitutional: Negative for fever, chills, diaphoresis, appetite change and fatigue.  HENT: Negative for mouth sores, sore throat and trouble swallowing.        Periorbital and facial headache.  Eyes: Positive for photophobia, pain and itching. Negative for discharge, redness and visual disturbance.  Respiratory: Positive for chest tightness. Negative for cough, shortness of breath and wheezing.   Cardiovascular: Positive for chest pain.  Gastrointestinal: Negative for nausea, vomiting, abdominal pain, diarrhea and abdominal distention.  Endocrine: Negative for polydipsia, polyphagia and polyuria.  Genitourinary: Negative for dysuria, frequency and hematuria.  Musculoskeletal: Negative for gait problem.  Skin: Negative for color change, pallor and rash.  Neurological: Positive for headaches. Negative for dizziness, syncope and light-headedness.        Left upper extremity tingling to the fourth and fifth digits.  Hematological: Does not bruise/bleed easily.  Psychiatric/Behavioral: Negative for behavioral problems and confusion.      Allergies  Review of patient's allergies indicates no known allergies.  Home Medications   Prior to Admission medications   Medication Sig Start Date End Date Taking? Authorizing Provider  amoxicillin (AMOXIL) 500 MG capsule Take 1 capsule (500 mg total) by mouth 3 (three) times daily. 08/07/13  Yes Lockland, NP  Phenyleph-Promethazine-Cod 5-6.25-10 MG/5ML SYRP Take 5 mLs by mouth every 6 (six) hours as needed. 08/07/13  Yes Hope Bunnie Pion, NP  predniSONE (DELTASONE) 10 MG tablet Take 4 tablets (40 mg total) by mouth daily. 08/05/12  Yes Malvin Johns, MD  sulfamethoxazole-trimethoprim (SEPTRA DS) 800-160 MG per tablet Take 1 tablet by mouth every 12 (twelve) hours. 08/05/12  Yes Malvin Johns, MD   BP 132/87  Pulse 68  Temp(Src) 98.3 F (36.8 C) (Oral)  Resp 20  Ht 5\' 8"  (1.727 m)  Wt 248 lb (112.492 kg)  BMI 37.72 kg/m2  SpO2 99%  LMP 02/04/2014 Physical Exam  Constitutional: She is oriented to person, place, and time. She appears well-developed and well-nourished. No distress.  Laying on his right side. Complains of mild nausea. Better after IV Zofran.  HENT:  Head: Normocephalic.  Eyes: Conjunctivae are normal. Pupils are equal, round, and reactive to light. No scleral icterus.  Neck: Normal range of  motion. Neck supple. No thyromegaly present.  Cardiovascular: Normal rate and regular rhythm.  Exam reveals no gallop and no friction rub.   No murmur heard. Pulmonary/Chest: Effort normal and breath sounds normal. No respiratory distress. She has no wheezes. She has no rales.  Abdominal: Soft. Bowel sounds are normal. She exhibits no distension. There is no tenderness. There is no rebound.  Abdomen is soft. has a mild diffuse tenderness but no focal tenderness to suggest biliary or appendix as  source. Peritoneal irritation  Musculoskeletal: Normal range of motion.  Neurological: She is alert and oriented to person, place, and time.  No cranial nerve deficits. Normal peripheral motor exam and is reexamined. She points to her left fourth and fifth digits is tingling the procedure her elbow but not into her upper arm. No pain at the neck or tenderness or symptoms with Spurling's to suggest radiculopathy.  Skin: Skin is warm and dry. No rash noted.  Psychiatric: She has a normal mood and affect. Her behavior is normal.    ED Course  Procedures (including critical care time) Labs Review Labs Reviewed  TROPONIN I  PREGNANCY, URINE  CBC WITH DIFFERENTIAL  BASIC METABOLIC PANEL    Imaging Review No results found.   EKG Interpretation None      Angiocath insertion Performed by: Lolita Patella  Consent: Verbal consent obtained. Risks and benefits: risks, benefits and alternatives were discussed Time out: Immediately prior to procedure a "time out" was called to verify the correct patient, procedure, equipment, support staff and site/side marked as required.  Preparation: Patient was prepped and draped in the usual sterile fashion.  Vein Location: Lt Upper arm, laterally  Yes Ultrasound Guided  Gauge: 20  Normal blood return and flush without difficulty Patient tolerance: Patient tolerated the procedure well with no immediate complications.     MDM   Final diagnoses:  None    Patient has a normal EKG. Is on oral contraceptives for BCLS. No wrist for PE. Not tachycardic. Not hypoxemic. CT of her head obtained and pending. Plan is treatment for migraine headache. Left upper extremity maybe peripheral neuropathy and ulnar distribution. Doubt ACS. Troponin pending. No pain or tenderness along the carotid. No neurological findings. No neck trauma. Doubt dissection as symptoms left-sided in head neck and arm.    Tanna Furry, MD 03/22/14 [phone]

## 2014-03-22 NOTE — Discharge Instructions (Signed)
Migraine Headache A migraine headache is an intense, throbbing pain on one or both sides of your head. A migraine can last for 30 minutes to several hours. CAUSES  The exact cause of a migraine headache is not always known. However, a migraine may be caused when nerves in the brain become irritated and release chemicals that cause inflammation. This causes pain. Certain things may also trigger migraines, such as:  Alcohol.  Smoking.  Stress.  Menstruation.  Aged cheeses.  Foods or drinks that contain nitrates, glutamate, aspartame, or tyramine.  Lack of sleep.  Chocolate.  Caffeine.  Hunger.  Physical exertion.  Fatigue.  Medicines used to treat chest pain (nitroglycerine), birth control pills, estrogen, and some blood pressure medicines. SIGNS AND SYMPTOMS  Pain on one or both sides of your head.  Pulsating or throbbing pain.  Severe pain that prevents daily activities.  Pain that is aggravated by any physical activity.  Nausea, vomiting, or both.  Dizziness.  Pain with exposure to bright lights, loud noises, or activity.  General sensitivity to bright lights, loud noises, or smells. Before you get a migraine, you may get warning signs that a migraine is coming (aura). An aura may include:  Seeing flashing lights.  Seeing bright spots, halos, or zigzag lines.  Having tunnel vision or blurred vision.  Having feelings of numbness or tingling.  Having trouble talking.  Having muscle weakness. DIAGNOSIS  A migraine headache is often diagnosed based on:  Symptoms.  Physical exam.  A CT scan or MRI of your head. These imaging tests cannot diagnose migraines, but they can help rule out other causes of headaches. TREATMENT Medicines may be given for pain and nausea. Medicines can also be given to help prevent recurrent migraines.  HOME CARE INSTRUCTIONS  Only take over-the-counter or prescription medicines for pain or discomfort as directed by your  health care provider. The use of long-term narcotics is not recommended.  Lie down in a dark, quiet room when you have a migraine.  Keep a journal to find out what may trigger your migraine headaches. For example, write down:  What you eat and drink.  How much sleep you get.  Any change to your diet or medicines.  Limit alcohol consumption.  Quit smoking if you smoke.  Get 7-9 hours of sleep, or as recommended by your health care provider.  Limit stress.  Keep lights dim if bright lights bother you and make your migraines worse. SEEK IMMEDIATE MEDICAL CARE IF:   Your migraine becomes severe.  You have a fever.  You have a stiff neck.  You have vision loss.  You have muscular weakness or loss of muscle control.  You start losing your balance or have trouble walking.  You feel faint or pass out.  You have severe symptoms that are different from your first symptoms. MAKE SURE YOU:   Understand these instructions.  Will watch your condition.  Will get help right away if you are not doing well or get worse. Document Released: 07/10/2005 Document Revised: 11/24/2013 Document Reviewed: 03/17/2013 [facility] Patient Information 2015 [place], [place]. This information is not intended to replace advice given to you by your health care provider. Make sure you discuss any questions you have with your health care provider.  Ulnar Nerve Contusion  The ulnar nerve lies near the surface of the skin as it passes by the elbow. This location causes it to be susceptible to injury. An ulnar nerve contusion is a bruise of the nerve.  It is the result of direct trauma to the elbow. Ulnar nerve contusions are characterized by pain, weakness, and loss of feeling in the hand. SYMPTOMS   Signs of nerve damage include: tingling, numbness, weakness, and/or loss of feeling in the hand, specifically the little finger and ring finger.  Sharp pains that may shoot from the elbow to the wrist and  hand.  Decreased hand function.  Tenderness and/ or inflammation in the elbow.  Muscle wasting (atrophy) in the hand. CAUSES  Ulnar nerve contusions are caused by direct trauma to the elbow that results in bleeding which enters the nerve. RISK INCREASES WITH:  Contact sports (football, soccer, or rugby).  Bleeding disorders.  Taking blood thinning medicine (warfarin [Coumadin], aspirin, or nonsteroidal anti-inflammatory medications).  Diabetes mellitus.  Underactive thyroid gland (hypothyroidism). PREVENTION  Wear properly fitted and padded protective equipment.  Only take blood thinning medication when necessary. PROGNOSIS  Ulnar nerve contusions usually heal within 6 weeks. Healing often occurs spontaneously, but treatment helps reduce symptoms.  RELATED COMPLICATIONS   Permanent nerve damage, including pain, numbness, tingling, or weakness in the hand (rare).  Weak grip.  Prolonged healing time, if improperly treated or re-injured. TREATMENT  Treatment initially involves resting from any activities that aggravate the symptoms, and the use of ice and medications to help reduce pain and inflammation. The use of strengthening and stretching exercises may help reduce pain with activity. These exercises may be performed at home or with referral to a therapist. Your caregiver may recommend that you splint the elbow at night to help healing of the nerve. If symptoms persist despite conservative (non-surgical) treatment, then surgery may be recommended to free the nerve. MEDICATION   If pain medication is necessary, then nonsteroidal anti-inflammatory medications, such as aspirin and ibuprofen, or other minor pain relievers, such as acetaminophen, are often recommended.  Do not take pain medication within 7 days before surgery.  Prescription pain relievers may be given if deemed necessary by your caregiver. Use only as directed and only as much as you need. COLD THERAPY  Cold  treatment (icing) relieves pain and reduces inflammation. Cold treatment should be applied for 10 to 15 minutes every 2 to 3 hours for inflammation and pain and immediately after any activity that aggravates your symptoms. Use ice packs or massage the area with a piece of ice (ice massage).

## 2014-04-22 ENCOUNTER — Encounter (HOSPITAL_BASED_OUTPATIENT_CLINIC_OR_DEPARTMENT_OTHER): Payer: Self-pay | Admitting: Emergency Medicine

## 2014-04-22 ENCOUNTER — Emergency Department (HOSPITAL_BASED_OUTPATIENT_CLINIC_OR_DEPARTMENT_OTHER)
Admission: EM | Admit: 2014-04-22 | Discharge: 2014-04-22 | Disposition: A | Discharge status: Home / Self Care | Payer: 59 | Attending: Emergency Medicine | Admitting: Emergency Medicine

## 2014-04-22 DIAGNOSIS — J3489 Other specified disorders of nose and nasal sinuses: Secondary | ICD-10-CM | POA: Insufficient documentation

## 2014-04-22 DIAGNOSIS — R05 Cough: Secondary | ICD-10-CM

## 2014-04-22 DIAGNOSIS — Z8742 Personal history of other diseases of the female genital tract: Secondary | ICD-10-CM | POA: Insufficient documentation

## 2014-04-22 DIAGNOSIS — R6883 Chills (without fever): Secondary | ICD-10-CM | POA: Insufficient documentation

## 2014-04-22 DIAGNOSIS — R059 Cough, unspecified: Secondary | ICD-10-CM | POA: Insufficient documentation

## 2014-04-22 DIAGNOSIS — R079 Chest pain, unspecified: Secondary | ICD-10-CM | POA: Insufficient documentation

## 2014-04-22 MED ORDER — AZITHROMYCIN 250 MG PO TABS: 250.0000 mg | ORAL_TABLET | Freq: Every day | ORAL | Status: DC

## 2014-04-22 MED ORDER — ALBUTEROL SULFATE HFA 108 (90 BASE) MCG/ACT IN AERS
2.0000 | INHALATION_SPRAY | Freq: Once | RESPIRATORY_TRACT | Status: AC
  Administered 2014-04-22: 2 via RESPIRATORY_TRACT
  Filled 2014-04-22: qty 6.7

## 2014-04-22 NOTE — ED Notes (Signed)
Reports cough x 2 weeks. Productive initially but dry now.

## 2014-04-22 NOTE — ED Provider Notes (Signed)
CSN: [id number]     Arrival date & time 04/22/14  8251 History   First MD Initiated Contact with Patient 04/22/14 [phone]     Chief Complaint  Patient presents with  . Cough     Patient is a 33 y.o. female presenting with cough. The history is provided by the patient.  Cough Cough characteristics:  Productive Sputum characteristics:  Yellow Severity:  Moderate Onset quality:  Gradual Duration:  2 weeks Timing:  Intermittent Progression:  Worsening Chronicity:  New Worsened by:  Nothing tried Associated symptoms: chills and sinus congestion   Associated symptoms comment:  Chest pain with cough  pt reports cough for past 2 weeks that is not improving No other complaints at this time  Past Medical History  Diagnosis Date  . Polycystic ovarian disease    History reviewed. No pertinent past surgical history. No family history on file. History  Substance Use Topics  . Smoking status: Never Smoker   . Smokeless tobacco: Not on file  . Alcohol Use: No   OB History   Grav Para Term Preterm Abortions TAB SAB Ect Mult Living                 Review of Systems  Constitutional: Positive for chills.  Respiratory: Positive for cough.        No hemoptysis reported      Allergies  Review of patient's allergies indicates no known allergies.  Home Medications   Prior to Admission medications   Medication Sig Start Date End Date Taking? Authorizing Provider  azithromycin (ZITHROMAX) 250 MG tablet Take 1 tablet (250 mg total) by mouth daily. Take first 2 tablets together, then 1 every day until finished. 04/22/14   Sharyon Cable, MD   BP 116/74  Pulse 70  Temp(Src) 97.9 F (36.6 C) (Oral)  Resp 14  Ht 5\' 6"  (1.676 m)  Wt 238 lb (107.956 kg)  BMI 38.43 kg/m2  SpO2 100%  LMP 03/17/2014 Physical Exam CONSTITUTIONAL: Well developed/well nourished HEAD: Normocephalic/atraumatic EYES: EOMI/PERRL ENMT: Mucous membranes moist NECK: supple no meningeal signs SPINE:entire  spine nontender CV: S1/S2 noted, no murmurs/rubs/gallops noted LUNGS: scant crackles in right base. No distress noted.  She can speak comfortably ABDOMEN: soft, nontender, no rebound or guarding NEURO: Pt is awake/alert, moves all extremitiesx4 EXTREMITIES: pulses normal, full ROM SKIN: warm, color normal PSYCH: no abnormalities of mood noted  ED Course  Procedures   Pt with cough for 2 weeks Crackles on exam could indicate early pneumonia Will start oral antibiotics She is well appearing/nontoxic and appropriate for discharge home MDM   Final diagnoses:  Cough    Nursing notes including past medical history and social history reviewed and considered in documentation     Sharyon Cable, MD 04/22/14 1334

## 2014-04-22 NOTE — Discharge Instructions (Signed)
Cough, Adult   A cough is a reflex. It helps you clear your throat and airways. A cough can help heal your body. A cough can last 2 or 3 weeks (acute) or may last more than 8 weeks (chronic). Some common causes of a cough can include an infection, allergy, or a cold.  HOME CARE  · Only take medicine as told by your doctor.  · If given, take your medicines (antibiotics) as told. Finish them even if you start to feel better.  · Use a cold steam vaporizer or humidifier in your home. This can help loosen thick spit (secretions).  · Sleep so you are almost sitting up (semi-upright). Use pillows to do this. This helps reduce coughing.  · Rest as needed.  · Stop smoking if you smoke.  GET HELP RIGHT AWAY IF:  · You have yellowish-white fluid (pus) in your thick spit.  · Your cough gets worse.  · Your medicine does not reduce coughing, and you are losing sleep.  · You cough up blood.  · You have trouble breathing.  · Your pain gets worse and medicine does not help.  · You have a fever.  MAKE SURE YOU:   · Understand these instructions.  · Will watch your condition.  · Will get help right away if you are not doing well or get worse.  Document Released: 03/23/2011 Document Revised: 11/24/2013 Document Reviewed: 03/23/2011  [employer]® Patient Information ©2015 [employer]. This information is not intended to replace advice given to you by your health care provider. Make sure you discuss any questions you have with your health care provider.

## 2014-07-20 ENCOUNTER — Emergency Department (HOSPITAL_BASED_OUTPATIENT_CLINIC_OR_DEPARTMENT_OTHER)
Admission: EM | Admit: 2014-07-20 | Discharge: 2014-07-20 | Disposition: A | Discharge status: Home / Self Care | Payer: PRIVATE HEALTH INSURANCE | Attending: Emergency Medicine | Admitting: Emergency Medicine

## 2014-07-20 ENCOUNTER — Emergency Department (HOSPITAL_BASED_OUTPATIENT_CLINIC_OR_DEPARTMENT_OTHER): Payer: PRIVATE HEALTH INSURANCE

## 2014-07-20 ENCOUNTER — Encounter (HOSPITAL_BASED_OUTPATIENT_CLINIC_OR_DEPARTMENT_OTHER): Payer: Self-pay

## 2014-07-20 DIAGNOSIS — J069 Acute upper respiratory infection, unspecified: Secondary | ICD-10-CM

## 2014-07-20 DIAGNOSIS — Z792 Long term (current) use of antibiotics: Secondary | ICD-10-CM | POA: Diagnosis not present

## 2014-07-20 DIAGNOSIS — J029 Acute pharyngitis, unspecified: Secondary | ICD-10-CM | POA: Diagnosis present

## 2014-07-20 DIAGNOSIS — R059 Cough, unspecified: Secondary | ICD-10-CM

## 2014-07-20 DIAGNOSIS — Z8639 Personal history of other endocrine, nutritional and metabolic disease: Secondary | ICD-10-CM | POA: Insufficient documentation

## 2014-07-20 DIAGNOSIS — R05 Cough: Secondary | ICD-10-CM

## 2014-07-20 LAB — RAPID STREP SCREEN (MED CTR MEBANE ONLY): Streptococcus, Group A Screen (Direct): NEGATIVE

## 2014-07-20 MED ORDER — DEXAMETHASONE 4 MG PO TABS
ORAL_TABLET | ORAL | Status: AC
  Filled 2014-07-20: qty 2

## 2014-07-20 MED ORDER — DEXAMETHASONE 4 MG PO TABS: 10.0000 mg | ORAL_TABLET | Freq: Once | ORAL | Status: DC

## 2014-07-20 MED ORDER — DEXAMETHASONE 4 MG PO TABS
8.0000 mg | ORAL_TABLET | Freq: Once | ORAL | Status: AC
  Administered 2014-07-20: 8 mg via ORAL

## 2014-07-20 NOTE — ED Provider Notes (Signed)
CSN: [id number]     Arrival date & time 07/20/14  4259 History   First MD Initiated Contact with Patient 07/20/14 [phone]     Chief Complaint  Patient presents with  . Sore Throat     (Consider location/radiation/quality/duration/timing/severity/associated sxs/prior Treatment) Patient is a 33 y.o. female presenting with pharyngitis. The history is provided by the patient.  Sore Throat This is a new problem. The current episode started 2 days ago. The problem occurs constantly. The problem has not changed since onset.Pertinent negatives include no chest pain, no abdominal pain, no headaches and no shortness of breath. Nothing aggravates the symptoms. Nothing relieves the symptoms. She has tried nothing for the symptoms. The treatment provided no relief.    Past Medical History  Diagnosis Date  . Polycystic ovarian disease    History reviewed. No pertinent past surgical history. No family history on file. History  Substance Use Topics  . Smoking status: Never Smoker   . Smokeless tobacco: Not on file  . Alcohol Use: No   OB History    No data available     Review of Systems  Constitutional: Negative for fever and fatigue.  HENT: Positive for sore throat. Negative for congestion and drooling.   Eyes: Negative for pain.  Respiratory: Positive for cough. Negative for shortness of breath.   Cardiovascular: Negative for chest pain.       Chest wall pain  Gastrointestinal: Negative for nausea, vomiting, abdominal pain and diarrhea.  Genitourinary: Negative for dysuria and hematuria.  Musculoskeletal: Negative for back pain, gait problem and neck pain.  Skin: Negative for color change.  Neurological: Negative for dizziness and headaches.  Hematological: Negative for adenopathy.  Psychiatric/Behavioral: Negative for behavioral problems.  All other systems reviewed and are negative.     Allergies  Review of patient's allergies indicates no known allergies.  Home Medications    Prior to Admission medications   Medication Sig Start Date End Date Taking? Authorizing Provider  azithromycin (ZITHROMAX) 250 MG tablet Take 1 tablet (250 mg total) by mouth daily. Take first 2 tablets together, then 1 every day until finished. 04/22/14   Sharyon Cable, MD   BP 147/82 mmHg  Pulse 88  Temp(Src) 99.1 F (37.3 C) (Oral)  Ht 5\' 8"  (1.727 m)  Wt 220 lb (99.791 kg)  BMI 33.46 kg/m2  SpO2 97%  LMP 03/20/2014 Physical Exam  Constitutional: She is oriented to person, place, and time. She appears well-developed and well-nourished.  HENT:  Head: Normocephalic.  Mouth/Throat: No oropharyngeal exudate.  Mildly enlarged tonsils in the posterior oropharynx. No exudate or asymmetry noted.  Mildly prominent right-sided anterior cervical adenopathy.  TM's clear bilaterally.   Eyes: Conjunctivae and EOM are normal. Pupils are equal, round, and reactive to light.  Neck: Normal range of motion. Neck supple.  Cardiovascular: Normal rate, regular rhythm, normal heart sounds and intact distal pulses.  Exam reveals no gallop and no friction rub.   No murmur heard. Pulmonary/Chest: Effort normal and breath sounds normal. No respiratory distress. She has no wheezes.  Abdominal: Soft. Bowel sounds are normal. There is no tenderness. There is no rebound and no guarding.  Musculoskeletal: Normal range of motion. She exhibits no edema or tenderness.  Neurological: She is alert and oriented to person, place, and time.  Skin: Skin is warm and dry.  Psychiatric: She has a normal mood and affect. Her behavior is normal.  Nursing note and vitals reviewed.   ED Course  Procedures (including critical  care time) Labs Review Labs Reviewed  RAPID STREP SCREEN  CULTURE, GROUP A STREP    Imaging Review Dg Chest 2 View  07/20/2014   CLINICAL DATA:  Sore throat and cough.  EXAM: CHEST - 2 VIEW  COMPARISON:  03/22/2014  FINDINGS: The heart size and mediastinal contours are within normal  limits. There is no evidence of pulmonary edema, consolidation, pneumothorax, nodule or pleural fluid. The visualized skeletal structures are unremarkable.  IMPRESSION: No active disease.   Electronically Signed   By: Aletta Edouard M.D.   On: 07/20/2014 08:38     EKG Interpretation None      MDM   Final diagnoses:  Viral URI    8:41 AM 33 y.o. female who presents with sore throat, mild cough, subjective fevers and chills for the last 2 days. No documented fevers. No shortness of breath. Occasional chest wall pain with coughing. Vital signs unremarkable here. Screening strep chest x-ray ordered prior to my evaluation. Likely Viral URI.   9:26 AM: I interpreted/reviewed the labs and/or imaging which were non-contributory.   I have discussed the diagnosis/risks/treatment options with the patient and believe the pt to be eligible for discharge home to follow-up with her pcp as needed. We also discussed returning to the ED immediately if new or worsening sx occur. We discussed the sx which are most concerning (e.g., trouble swallowing, sob, cp) that necessitate immediate return. Medications administered to the patient during their visit and any new prescriptions provided to the patient are listed below.  Medications given during this visit Medications  dexamethasone (DECADRON) tablet 10 mg (not administered)    New Prescriptions   No medications on file     Pamella Pert, MD 07/20/14 [phone]

## 2014-07-20 NOTE — ED Notes (Signed)
Pt reports sore throat x 2 days.  Reports feels as though tonsils are swollen.  Also reports cough with greenish yellow sputum.

## 2014-07-22 LAB — CULTURE, GROUP A STREP

## 2014-10-02 ENCOUNTER — Other Ambulatory Visit: Payer: Self-pay | Admitting: Obstetrics & Gynecology

## 2014-10-02 DIAGNOSIS — R7989 Other specified abnormal findings of blood chemistry: Secondary | ICD-10-CM

## 2014-10-02 DIAGNOSIS — E229 Hyperfunction of pituitary gland, unspecified: Principal | ICD-10-CM

## 2014-10-11 ENCOUNTER — Other Ambulatory Visit: Payer: PRIVATE HEALTH INSURANCE

## 2015-05-11 ENCOUNTER — Ambulatory Visit (INDEPENDENT_AMBULATORY_CARE_PROVIDER_SITE_OTHER): Payer: PRIVATE HEALTH INSURANCE | Admitting: Internal Medicine

## 2015-05-11 ENCOUNTER — Encounter: Payer: Self-pay | Admitting: Internal Medicine

## 2015-05-11 VITALS — BP 114/70 | HR 64 | Temp 97.7°F | Resp 12 | Ht 68.0 in | Wt 239.6 lb

## 2015-05-11 DIAGNOSIS — E221 Hyperprolactinemia: Secondary | ICD-10-CM | POA: Diagnosis not present

## 2015-05-11 DIAGNOSIS — R7989 Other specified abnormal findings of blood chemistry: Secondary | ICD-10-CM

## 2015-05-11 DIAGNOSIS — E229 Hyperfunction of pituitary gland, unspecified: Secondary | ICD-10-CM | POA: Diagnosis not present

## 2015-05-11 DIAGNOSIS — D352 Benign neoplasm of pituitary gland: Secondary | ICD-10-CM | POA: Diagnosis not present

## 2015-05-11 LAB — T4, FREE: Free T4: 0.79 ng/dL (ref 0.60–1.60)

## 2015-05-11 LAB — T3, FREE: T3, Free: 3.2 pg/mL (ref 2.3–4.2)

## 2015-05-11 LAB — CORTISOL: CORTISOL PLASMA: 9.2 ug/dL

## 2015-05-11 LAB — TSH: TSH: 2.54 u[IU]/mL (ref 0.35–4.50)

## 2015-05-11 NOTE — Progress Notes (Signed)
Patient ID: Courtney Snyder, female   DOB: 10-03-1980, 34 y.o.   MRN: [id number]   HPI  Courtney Snyder is a 34 y.o.-year-old female, referred by her ObGyn Dr, Dr Dellis Filbert, for evaluation for pituitary adenoma and hyperprolactinemia.  Reviewing the records from Dr. Dellis Filbert, patient had milky discharge from breasts and HA along with irregular menses >> a prolactin was checked and it was elevated, at 120 (1.9-25) on 09/24/2014. At that time, a TSH was normal, at 3.12. Of note, patient has long-standing oligomenorrhea. She was on Tri-Sprintec, and now she is on Microgestin Fe OCP.  She was sent for pituitary MRI ([facility]), which she had on 10/17/2014 >> pituitary microadenoma 5x7x8 mm mass in suprasellar cistern with some possible encasing L internal carotid a.    She was started on cabergoline 0.25 mg initially 2x a week, then change once a week b/c of palpitations - started 11/2014 >> Menses returned and normalized, and breast discharge stopped. She has been off x1 week. Her breast d/c started back after she stopped the med.   No change in shoe size or ring size. No DM, HTN.  Patient describes - + Headaches  - + Milky discharge from breast - + Breast tension - no diplopia or visual field cuts but some blurry vision  Pt also c/o: - no weight gain or loss - + fatigue - falling asleep easily - + sleeps well at night - + constipation - no dry skin - no hair loss - no depression/anxiety  ROS: Constitutional: no weight gain/loss, + fatigue, + subjective hypothermia Eyes: + some blurry vision, no xerophthalmia ENT: no sore throat, no nodules palpated in throat, no dysphagia/odynophagia, no hoarseness Cardiovascular: no CP/SOB/+ palpitationsno /leg swelling Respiratory: no cough/SOB Gastrointestinal: no N/V/D/+ C Musculoskeletal: + muscle aches/no joint aches Skin: no rashes, + stretch marks Neurological: no tremors/numbness/tingling/dizziness Psychiatric: no depression/anxiety + Also see  history of present illness  Past Medical History  Diagnosis Date  . Polycystic ovarian disease    No past surgical history on file. Social History   Social History  . Marital Status: Single    Spouse Name: N/A  . Number of Children: 0   Occupational History  . billing   Social History Main Topics  . Smoking status: Never Smoker   . Smokeless tobacco: Not on file  . Alcohol Use: No  . Drug Use: No   Current Outpatient Rx  Name  Route  Sig  Dispense  Refill  . MICROGESTIN FE 1/20 1-20 MG-MCG tablet      TK 1 T PO D      3     Dispense as written.   . cabergoline (DOSTINEX) 0.5 MG tablet   Oral   Take 0.25 mg by mouth.          No Known Allergies  Family history:  Cancer in grandmother's and paternal grandfather No family history of diabetes, hypertension, hyperlipidemia, heart disease and thyroid problems.  PE: BP 114/70 mmHg  Pulse 64  Temp(Src) 97.7 F (36.5 C) (Oral)  Resp 12  Ht 5\' 8"  (1.727 m)  Wt 239 lb 9.6 oz (108.682 kg)  BMI 36.44 kg/m2  SpO2 98% Wt Readings from Last 3 Encounters:  05/11/15 239 lb 9.6 oz (108.682 kg)  07/20/14 220 lb (99.791 kg)  04/22/14 238 lb (107.956 kg)   Constitutional: overweight, in NAD Eyes: PERRLA, EOMI, no exophthalmos ENT: moist mucous membranes, no thyromegaly, no cervical lymphadenopathy Cardiovascular: RRR, No MRG Respiratory: CTA B Gastrointestinal:  abdomen soft, NT, ND, BS+ Musculoskeletal: no deformities, strength intact in all 4 Skin: moist, warm, no rashes Neurological: no tremor with outstretched hands, DTR normal in all 4  ASSESSMENT: 1. Pituitary microadenoma  2. Hyperprolactinemia  PLAN:  1. And 2. Patient with a newly diagnosed pituitary microadenoma, with an elevated prolactin and sxs of hyperprolactinemia: irregular menses and galactorrhea. She was started on cabergoline, with resolution of her symptoms. She is now off cabergoline after she ran out of the medication, and she started to  have breast discharge again. She initially had palpitations with cabergoline, therefore, the dose had to be reduced once a week. She mentions that the palpitations have improved after she got used to the medication. She also has headaches, which are not new, but they are exacerbating the day after taking the cabergoline. She is taking the cabergoline in the morning, rather than at night, so I advised her to move this at night. We discussed about other possible side effects: congestion, dizziness, nausea, sleepiness. - I reviewed the pituitary MRI report along with the patient. I explained that, since the tumor is under 1 cm, this qualifies as a micro-, rather than a macro-adenoma.  She signed a release of information request form, so I can obtain a CD with the images of her MRI. - I explained that this is not a "brain tumor". These tumors are extremely rarely malignant, the vast majority of them are benign.  - We discussed the plan for follow-up: We need to restart cabergoline, since she has return of her symptoms off the medication. Will start with once a week, but I will recheck her prolactin level today, and we may need to increase the dose to twice a week. She agrees with the plan. We'll recheck another prolactin level in 2 months. I plan to repeat a pituitary MRI in a year. We discussed that we may try to stop cabergoline if the prolactin levels are controlled after 2 years on the medication. - We discussed that some pituitary tumors can close secrete other hormones, for example growth hormone. Today I will check the rest of her pituitary hormones: Orders Placed This Encounter  Procedures  . Cortisol  . ACTH  . T3, free  . T4, free  . Insulin-like growth factor  . Prolactin  . TSH  - I will see the patient back in 6 months, but she will return in 2 months for recheck of her prolactin.  Component     Latest Ref Rng 05/11/2015  IGF-I, LC/MS     53 - 331 ng/mL 67  Z-Score (Female)      -2.0-+2.0 SD -1.6  Cortisol, Plasma 9.2  C206 ACTH     6 - 50 pg/mL 14  T3, Free     2.3 - 4.2 pg/mL 3.2  Free T4     0.60 - 1.60 ng/dL 0.79  Prolactin 187.1  TSH     0.35 - 4.50 uIU/mL 2.54  Due to the high prolactin level, I will advise the patient to increase the cabergoline dose to 0.25 twice a week. We'll recheck the prolactin in 1.5 months.  The rest of the pituitary labs are normal.

## 2015-05-11 NOTE — Patient Instructions (Signed)
Please stop at the lab.  Please restart Cabergoline 0.25 mg once a week.  Please come back in 2 months for a Prolactin check, and in 6 months for a visit.

## 2015-05-12 LAB — PROLACTIN: Prolactin: 187.1 ng/mL

## 2015-05-14 ENCOUNTER — Telehealth: Payer: Self-pay | Admitting: Internal Medicine

## 2015-05-14 LAB — ACTH: C206 ACTH: 14 pg/mL (ref 6–50)

## 2015-05-14 LAB — INSULIN-LIKE GROWTH FACTOR
IGF-I, LC/MS: 67 ng/mL (ref 53–331)
Z-SCORE (FEMALE): -1.6 {STDV} (ref ?–2.0)

## 2015-05-14 MED ORDER — CABERGOLINE 0.5 MG PO TABS: 0.2500 mg | ORAL_TABLET | ORAL | Status: DC

## 2015-05-14 NOTE — Telephone Encounter (Signed)
Patient stated that pharmacy haven't received medication cabergoline (DOSTINEX) 0.5 MG tablet, please send  [id number] - [place], [place] [phone] (Phone) [phone] (Fax)

## 2015-05-14 NOTE — Addendum Note (Signed)
Addended by: Philemon Kingdom on: 05/14/2015 04:07 PM   Modules accepted: Orders

## 2015-05-17 DIAGNOSIS — D352 Benign neoplasm of pituitary gland: Secondary | ICD-10-CM | POA: Insufficient documentation

## 2015-05-17 MED ORDER — CABERGOLINE 0.5 MG PO TABS: 0.2500 mg | ORAL_TABLET | ORAL | Status: DC

## 2015-05-29 IMAGING — CT CT HEAD W/O CM
1 series · 16 of 30 positions shown, 20 images · non-contrast
Comparison: None.

CLINICAL DATA: Left-sided headaches

EXAM:
CT HEAD WITHOUT CONTRAST
TECHNIQUE: Contiguous axial images were obtained from the base of the skull
through the vertex without intravenous contrast.

[Series 2: head 4.8 h37s · axial · 0.43mm/px · z∈[-169,-32]mm · 16 of 32 slices shown, 20 images]
[im 2/32  brain]
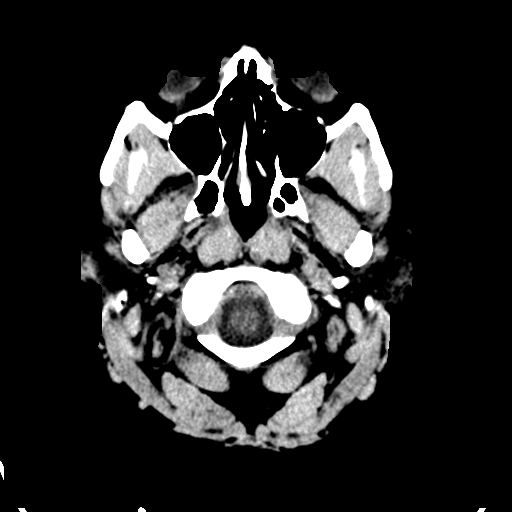
[im 2/32  bone]
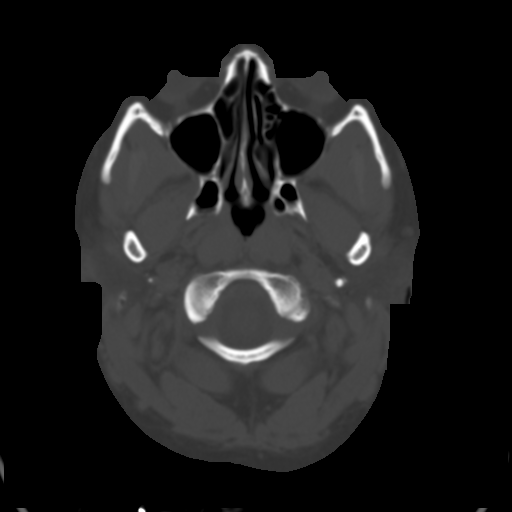
[im 4/32  brain]
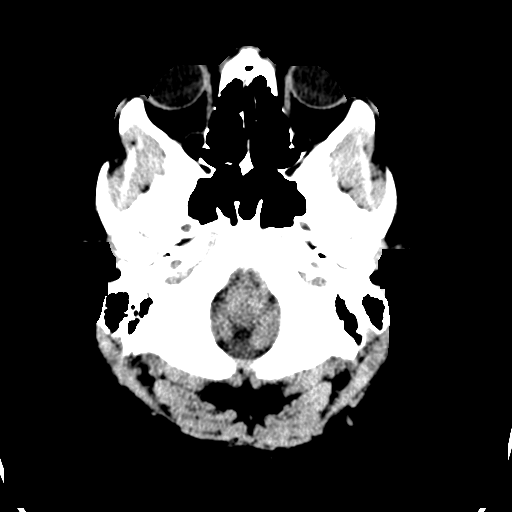
[im 6/32  brain]
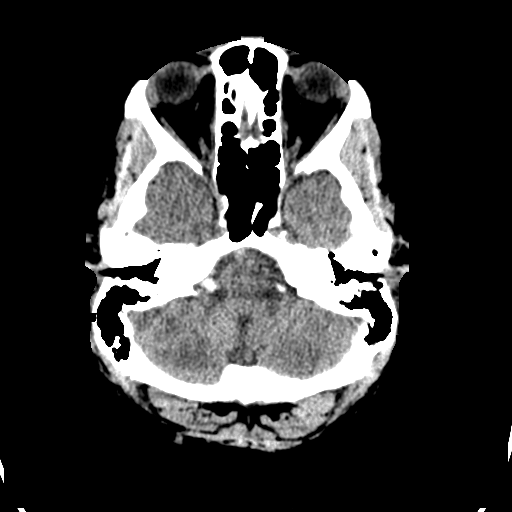
[im 8/32  brain]
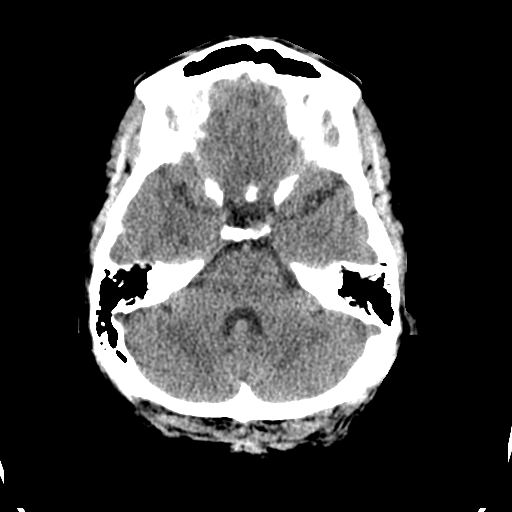
[im 9/32  brain]
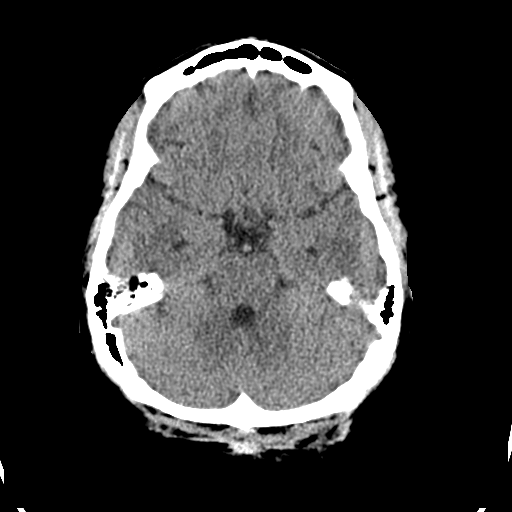
[im 9/32  bone]
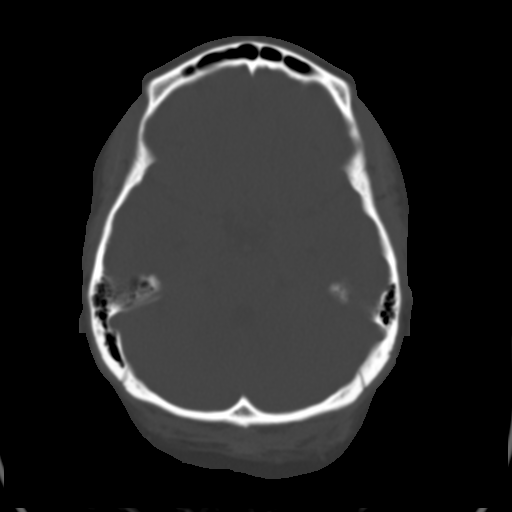
[im 11/32  brain]
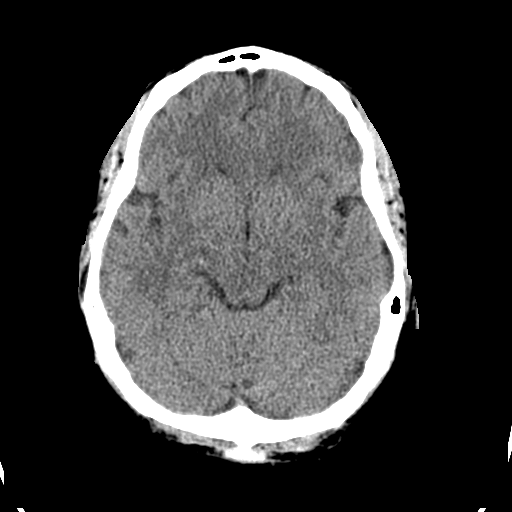
[im 13/32  brain]
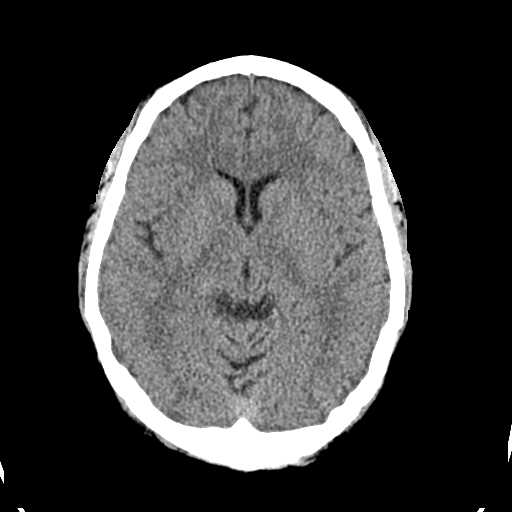
[im 15/32  brain]
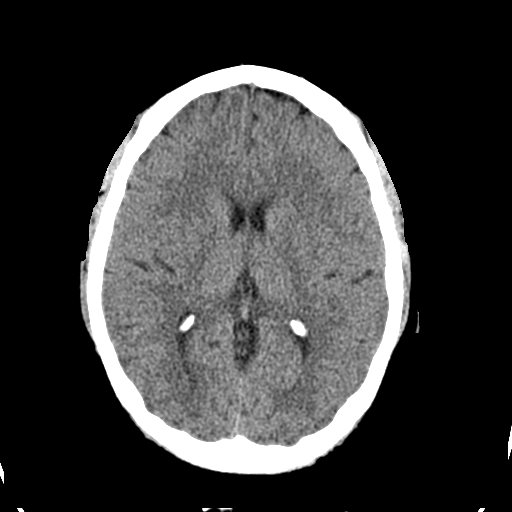
[im 17/32  brain]
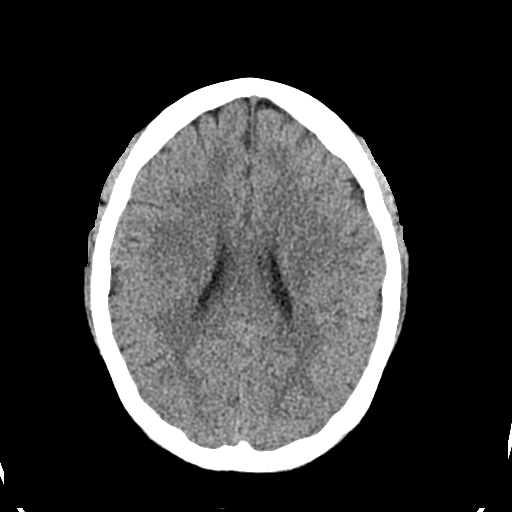
[im 17/32  bone]
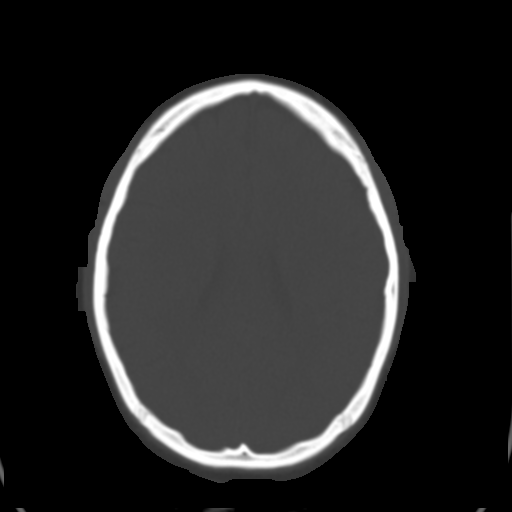
[im 19/32  brain]
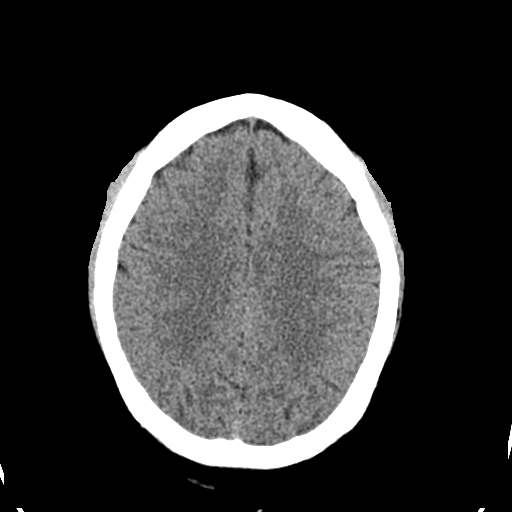
[im 21/32  brain]
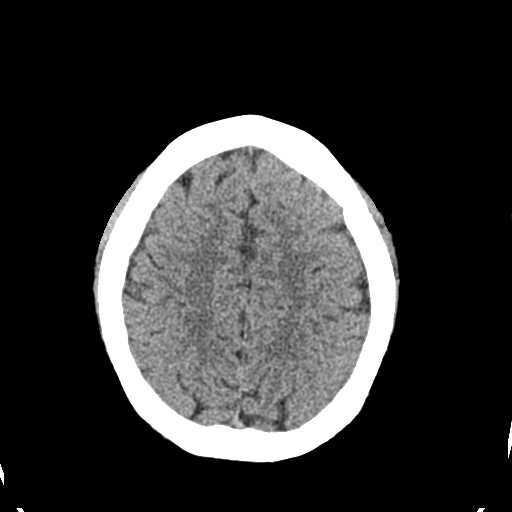
[im 23/32  brain]
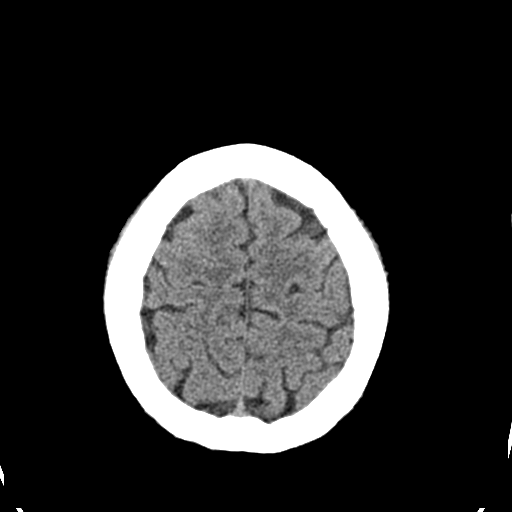
[im 24/32  brain]
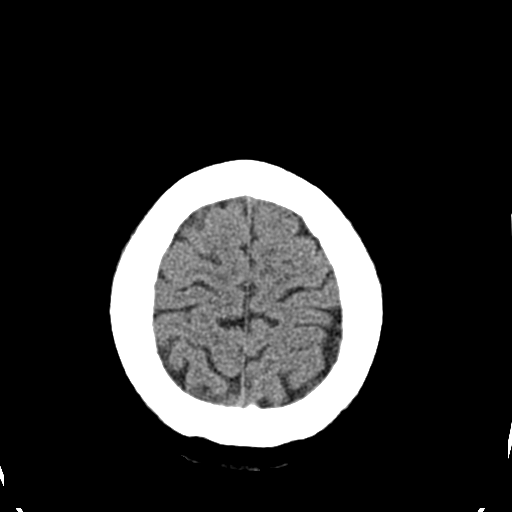
[im 24/32  bone]
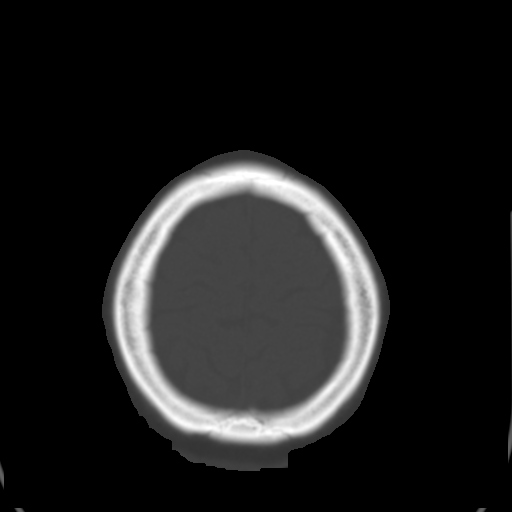
[im 26/32  brain]
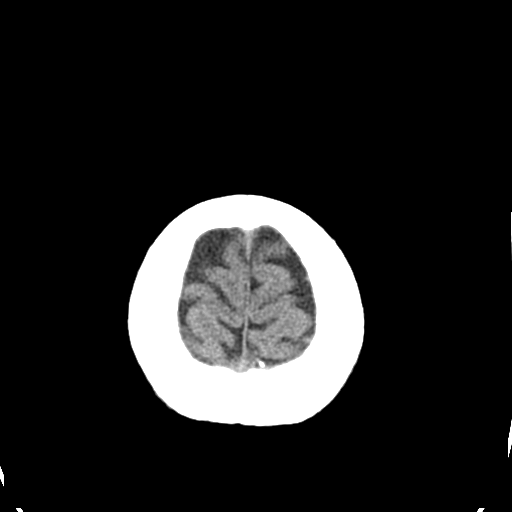
[im 28/32  brain]
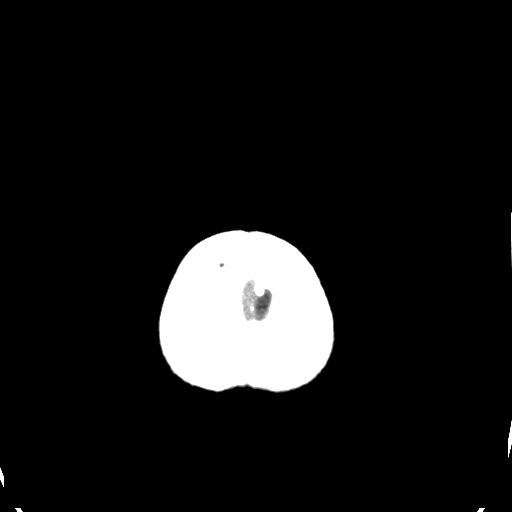
[im 30/32  brain]
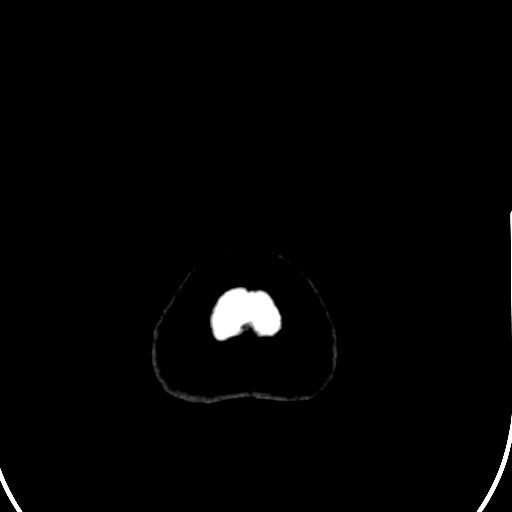

[16 of 30 positions shown; findings below may reference images not displayed]

FINDINGS: The bony calvarium is intact. The ventricles are of normal size and
configuration. No findings to suggest acute hemorrhage, acute
infarction or space-occupying mass lesion are noted.
IMPRESSION: No acute intracranial abnormality noted.

## 2015-05-31 ENCOUNTER — Other Ambulatory Visit: Payer: Self-pay | Admitting: *Deleted

## 2015-05-31 MED ORDER — CABERGOLINE 0.5 MG PO TABS: 0.2500 mg | ORAL_TABLET | ORAL | Status: DC

## 2015-09-26 IMAGING — CR DG CHEST 2V
2 series · 2 of 2 positions shown · non-contrast
Comparison: 03/22/2014

CLINICAL DATA: Sore throat and cough.

EXAM:
CHEST - 2 VIEW

[w chest pa]
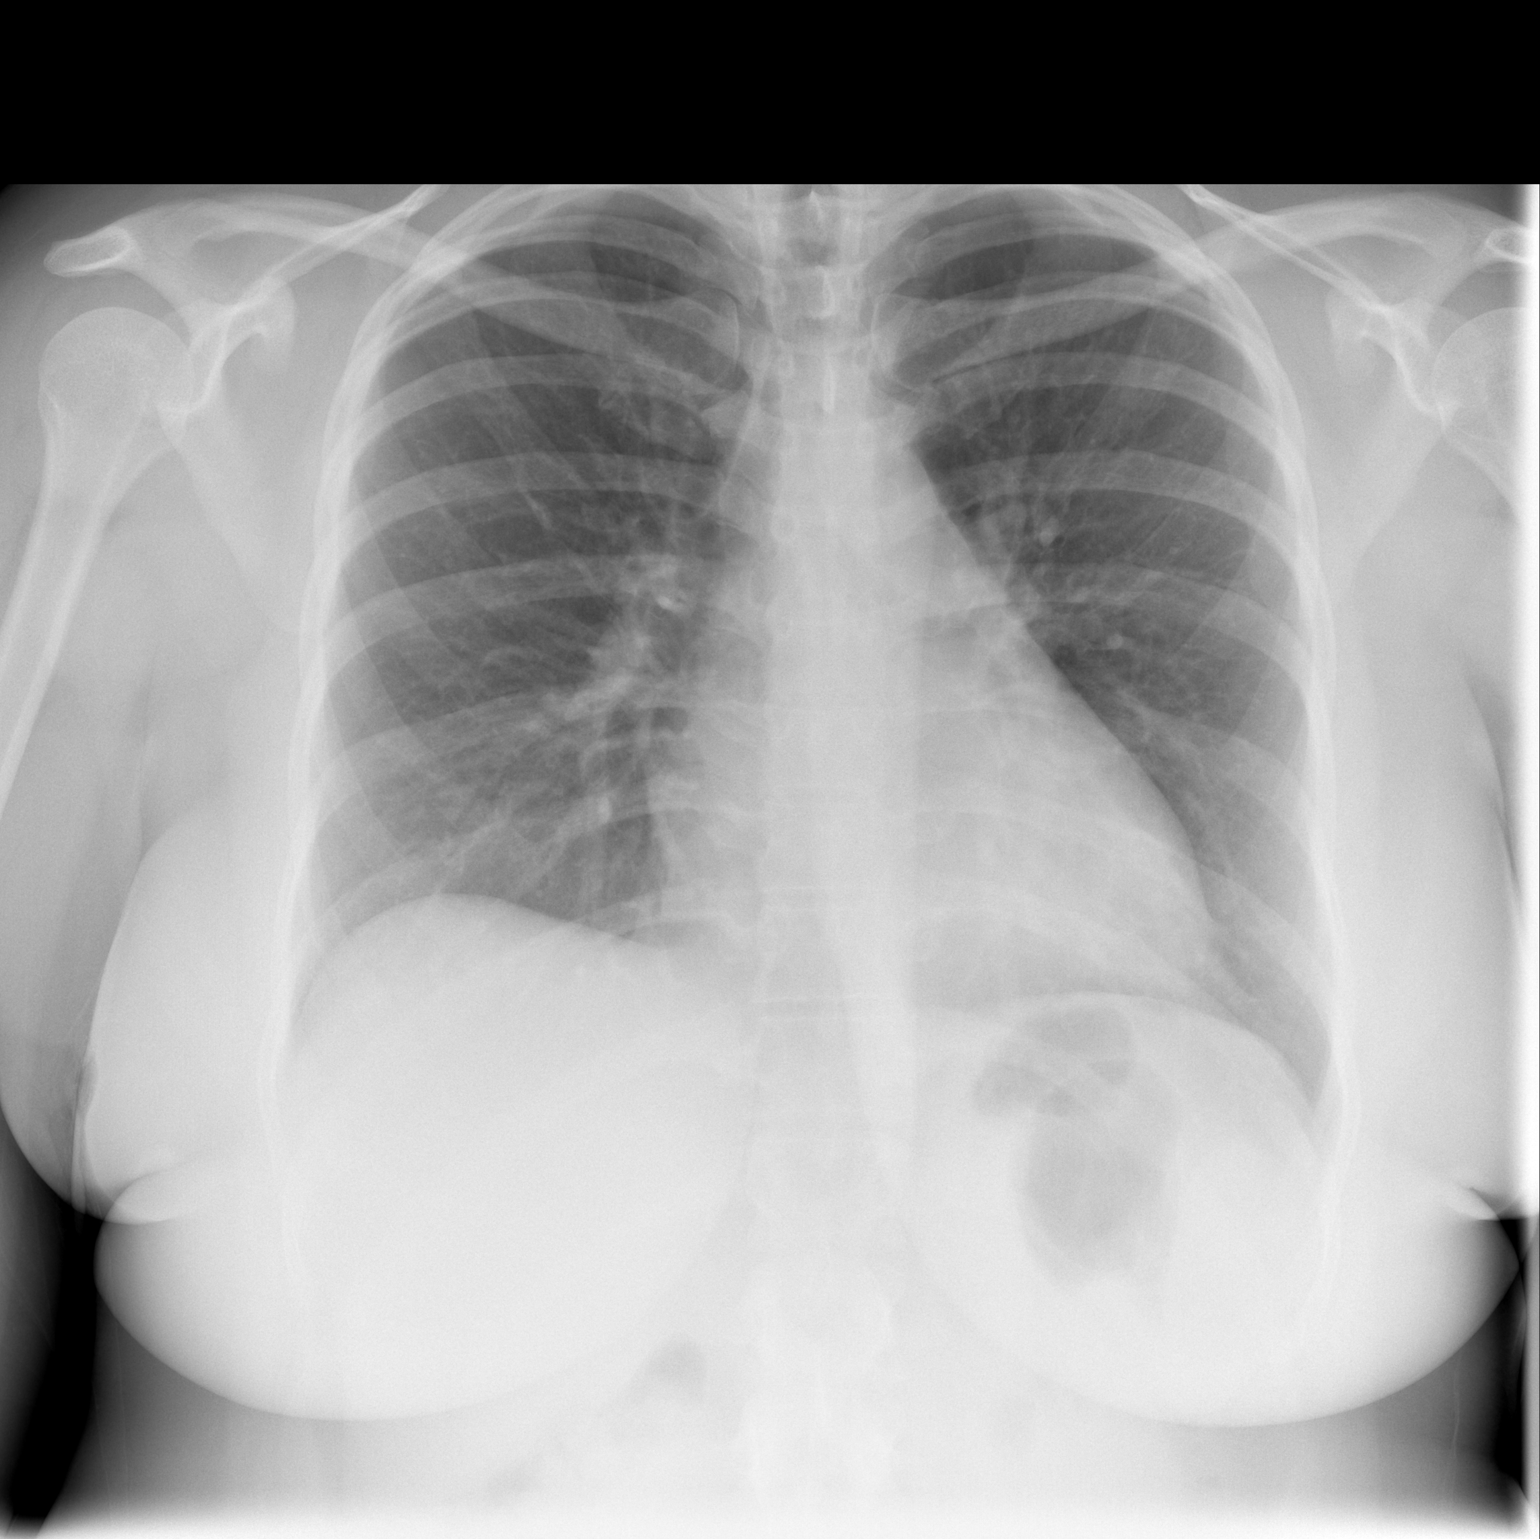

[w chest lat]
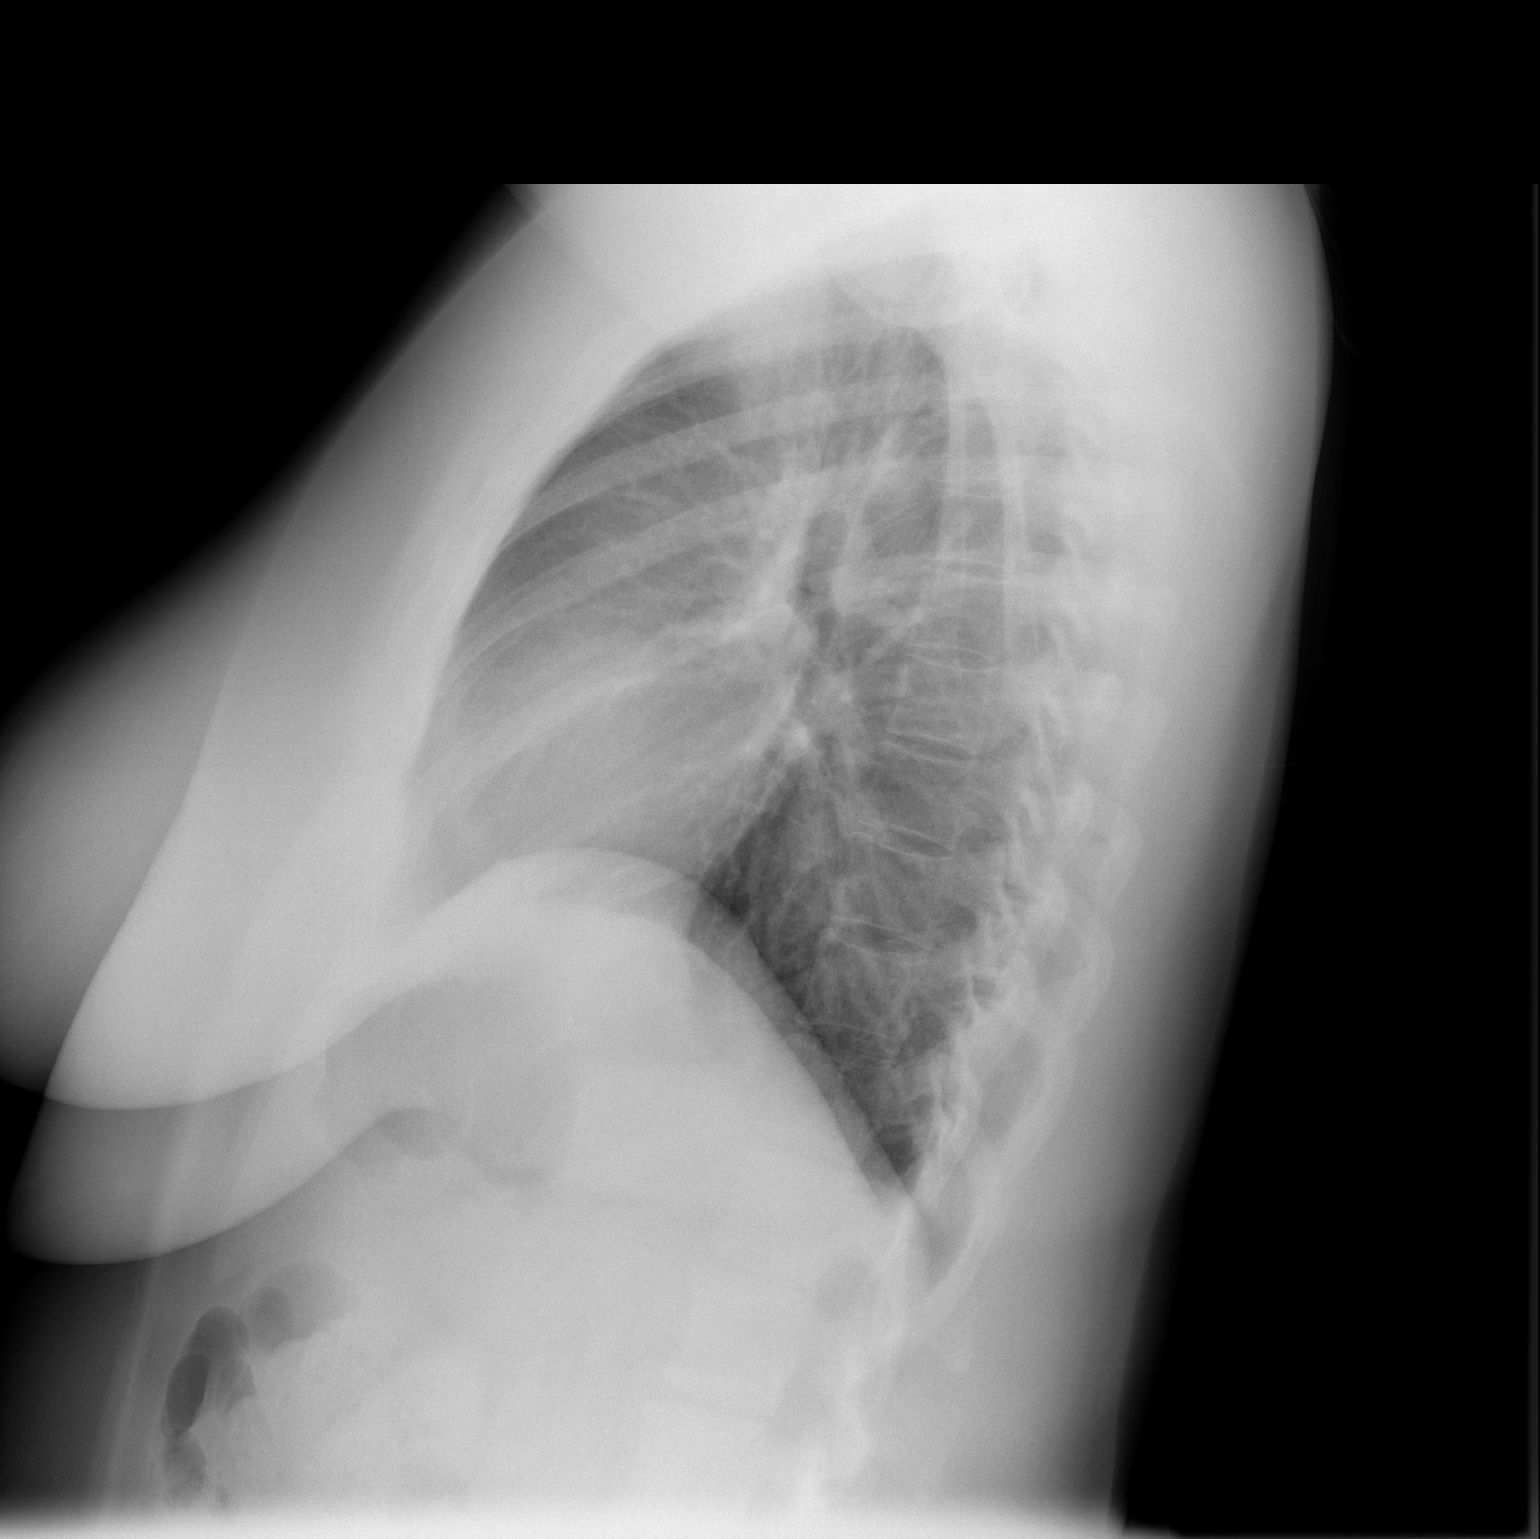

[2 of 2 positions shown; findings below may reference images not displayed]

FINDINGS: The heart size and mediastinal contours are within normal limits.
There is no evidence of pulmonary edema, consolidation,
pneumothorax, nodule or pleural fluid. The visualized skeletal
structures are unremarkable.
IMPRESSION: No active disease.

## 2015-11-09 ENCOUNTER — Ambulatory Visit (INDEPENDENT_AMBULATORY_CARE_PROVIDER_SITE_OTHER): Payer: PRIVATE HEALTH INSURANCE | Admitting: Internal Medicine

## 2015-11-09 VITALS — BP 118/78 | HR 70 | Temp 98.2°F | Resp 12 | Wt 244.0 lb

## 2015-11-09 DIAGNOSIS — D352 Benign neoplasm of pituitary gland: Secondary | ICD-10-CM | POA: Diagnosis not present

## 2015-11-09 DIAGNOSIS — E221 Hyperprolactinemia: Secondary | ICD-10-CM | POA: Insufficient documentation

## 2015-11-09 NOTE — Patient Instructions (Signed)
Please continue Cabergoline 0.25 mg 2x a week.  Please stop at the lab.  Please return in 1 year.

## 2015-11-09 NOTE — Progress Notes (Signed)
Patient ID: Courtney Snyder, female   DOB: [birth year], 35 y.o.   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 Last visit 6 mo ago.  Reviewed and addended hx: Reviewing the records from Dr. Dellis Filbert, patient had milky discharge from breasts and HA along with irregular menses >> a prolactin was checked and it was elevated, at 120 (1.9-25) on 09/24/2014. At that time, a TSH was normal, at 3.12. Of note, patient has long-standing oligomenorrhea. She was on Tri-Sprintec, and now she is on Microgestin Fe OCP.  She was sent for pituitary MRI ([facility]), which she had on 10/17/2014 >> pituitary microadenoma 5x7x8 mm mass in suprasellar cistern with some possible encasing L internal carotid a.    She was started on cabergoline 0.25 mg initially 2x a week, then change once a week b/c of palpitations - started 11/2014 >> Menses returned and normalized, and breast discharge stopped. Her breast d/c started back after she stopped the med.   No change in shoe size or ring size. No DM, HTN.  At last visit, patient described: - + Headaches  - + Milky discharge from breast - + Breast tension - no diplopia or visual field cuts but some blurry vision  A prolactin level was elevated while the rest of the pituitary hormonal  w/up was negative: Component     Latest Ref Rng 05/11/2015  IGF-I, LC/MS     53 - 331 ng/mL 67  Z-Score (Female)     -2.0-+2.0 SD -1.6  Cortisol, Plasma 9.2  C206 ACTH     6 - 50 pg/mL 14  T3, Free     2.3 - 4.2 pg/mL 3.2  Free T4     0.60 - 1.60 ng/dL 0.79  Prolactin 187.1  TSH     0.35 - 4.50 uIU/mL 2.54   Due to the high prolactin level, I will advised the patient to start cabergoline dose to 0.25 twice a week And return in 6 weeks for repeat prolactin level. She did not return for labs.   At this visit, patient mentions: - resolved galactorrhea -  regular menses - + SCM tensing/cramping the night after taking Cabergoline - + drowsiness, fatigue - no weight gain or loss - + sleeps well at night - resolved constipation - no dry skin - no hair loss - no depression/anxiety  ROS: Constitutional: no weight gain/loss, + fatigue, + subjective hypothermia Eyes: no blurry vision, no xerophthalmia ENT: no sore throat, no nodules palpated in throat, no dysphagia/odynophagia, no hoarseness Cardiovascular: no CP/SOB/+ palpitationsno /leg swelling Respiratory: no cough/SOB Gastrointestinal: no N/V/D/+ C Musculoskeletal: + muscle aches/no joint aches Skin: no rashes, + stretch marks Neurological: no tremors/numbness/tingling/dizziness  I reviewed pt's medications, allergies, PMH, social hx, family hx, and changes were documented in the history of present illness. Otherwise, unchanged from my initial visit note:  Past Medical History  Diagnosis Date  . Polycystic ovarian disease    No past surgical history on file. Social History   Social History  . Marital Status: Single    Spouse Name: N/A  . Number of Children: 0   Occupational History  . billing   Social History Main Topics  . Smoking status: Never Smoker   . Smokeless tobacco: Not on file  . Alcohol Use: No  . Drug Use: No   Current Outpatient Rx  Name  Route  Sig  Dispense  Refill  . cabergoline (DOSTINEX) 0.5 MG tablet   Oral   Take 0.5 tablets (0.25 mg total) by mouth 2 (two) times a week.   30 tablet   1   . MICROGESTIN FE 1/20 1-20 MG-MCG tablet      TK 1 T PO D      3     Dispense as written.    No Known Allergies  Family history:  Cancer in grandmother's and paternal grandfather No family history of diabetes, hypertension, hyperlipidemia, heart disease and thyroid problems.  PE: BP 118/78 mmHg  Pulse 70  Temp(Src) 98.2 F (36.8 C) (Oral)  Resp 12  Wt 244 lb (110.678 kg)  SpO2 98% Body mass index is 37.11 kg/(m^2). Wt Readings from Last 3  Encounters:  11/09/15 244 lb (110.678 kg)  05/11/15 239 lb 9.6 oz (108.682 kg)  07/20/14 220 lb (99.791 kg)   Constitutional: overweight, in NAD Eyes: PERRLA, EOMI, no exophthalmos ENT: moist mucous membranes, no thyromegaly, no cervical lymphadenopathy Cardiovascular: RRR, No MRG Respiratory: CTA B Gastrointestinal: abdomen soft, NT, ND, BS+ Musculoskeletal: no deformities, strength intact in all 4 Skin: moist, warm, no rashes Neurological: no tremor with outstretched hands, DTR normal in all 4  ASSESSMENT: 1. Pituitary microadenoma  2. Hyperprolactinemia  PLAN:  1. And 2. Patient with h/o pituitary microadenoma, with an elevated prolactin and sxs of hyperprolactinemia: irregular menses and galactorrhea. She was started on cabergoline, with resolution of her symptoms, but then stopped the medication after she ran out of it >> started to have breast discharge again. As the prolactin level was high at last visit, at 187, we restarted the cabergoline 0.25 mg twice a week. She continues on this dose now. She did not return for labs in 6 weeks after we started cabergoline. Her galactorrhea resolved and her menses are now regular. No HAs, but she does have neck mm tightness the day after she takes Cabergoline. - I reviewed the pituitary MRI report along with the patient. I explained that, since the tumor is under 1 cm, this qualifies as a micro-, rather than a macro-adenoma.  We do not need to repeat the imaging if the prolactin level decreases with cabergoline. - We also reviewed together the rest of the pituitary hormonal workup which returned normal - she is planning to try for a pregnancy next year >> we discussed that we need to stop cabergoline for the duration of pregnancy and possibly before - I asked her to let me know when they will start trying to conceive - Today we will check her prolactin level and adjust the cabergoline dose accordingly. - I will see the patient back in 1  year  Office Visit on 11/09/2015  Component Date Value Ref Range Status  . Prolactin 11/09/2015 2.9   Final   Comment: Reference Range Adult Female  Non-Pregnant 3.0-30.0 ng/mL  Pregnant 10.0-209.0 ng/mL  Postmenopausal 2.0-20.0 ng/mL      Msg sent: Dear Ms Harrington Challenger, Prolactin level is excellent! We can reduce the Cabergoline dose to 0.25 mg 1x a week. Please return for a repeat prolactin level 2 months after the change in dose. Please call our main office number [phone]) to schedule a lab appointment.  Sincerely, Philemon Kingdom MD

## 2015-11-10 ENCOUNTER — Encounter: Payer: Self-pay | Admitting: Internal Medicine

## 2015-11-10 LAB — PROLACTIN: Prolactin: 2.9 ng/mL

## 2015-11-10 MED ORDER — CABERGOLINE 0.5 MG PO TABS: 0.2500 mg | ORAL_TABLET | ORAL | Status: AC

## 2016-05-18 ENCOUNTER — Telehealth: Payer: Self-pay | Admitting: Internal Medicine

## 2016-05-18 ENCOUNTER — Telehealth: Payer: Self-pay

## 2016-05-18 NOTE — Telephone Encounter (Signed)
Pt called and said that she was returning Julie's call.

## 2016-05-18 NOTE — Telephone Encounter (Signed)
Pt wants to start the family planning process, what are her next steps

## 2016-05-18 NOTE — Telephone Encounter (Signed)
Called and left message advising patient of what to do for the family planning process from [place]. Gave call back number.

## 2016-05-18 NOTE — Telephone Encounter (Signed)
Called patient and notified of what to do for family planning process, patient understood, had no questions at this time.

## 2016-05-18 NOTE — Telephone Encounter (Signed)
Let's have her back for a prolactin check. After this, she needs to stop cabergoline right after she confirms that she is pregnant. She needs to be off cabergoline for the entire duration of pregnancy and breast-feeding. We can then check a prolactin and restart the medication.

## 2016-05-26 ENCOUNTER — Other Ambulatory Visit: Payer: PRIVATE HEALTH INSURANCE

## 2016-05-26 DIAGNOSIS — D352 Benign neoplasm of pituitary gland: Secondary | ICD-10-CM

## 2016-05-27 LAB — PROLACTIN: Prolactin: 10.3 ng/mL

## 2016-07-05 ENCOUNTER — Other Ambulatory Visit: Payer: Self-pay | Admitting: Internal Medicine

## 2016-11-08 ENCOUNTER — Ambulatory Visit: Payer: PRIVATE HEALTH INSURANCE | Admitting: Internal Medicine

## 2017-02-20 ENCOUNTER — Telehealth: Payer: Self-pay | Admitting: Internal Medicine

## 2017-02-20 ENCOUNTER — Ambulatory Visit: Payer: PRIVATE HEALTH INSURANCE | Admitting: Internal Medicine

## 2017-02-20 DIAGNOSIS — Z0289 Encounter for other administrative examinations: Secondary | ICD-10-CM

## 2017-02-20 NOTE — Telephone Encounter (Signed)
Patient no showed today's appt. Please advise on how to follow up. °A. No follow up necessary. °B. Follow up urgent. Contact patient immediately. °C. Follow up necessary. Contact patient and schedule visit in ___ days. °D. Follow up advised. Contact patient and schedule visit in ____weeks. ° °

## 2017-02-20 NOTE — Telephone Encounter (Signed)
6 mo
